# Patient Record
Sex: Female | Born: 1960 | Race: Black or African American | Hispanic: No | Marital: Single | Smoking: Never smoker
Health system: Southern US, Community
[De-identification: ages and names within clinical notes are randomized; demographics above are authoritative.]

## PROBLEM LIST (undated history)

## (undated) DIAGNOSIS — I10 Essential (primary) hypertension: Secondary | ICD-10-CM

## (undated) HISTORY — DX: Essential (primary) hypertension: I10

## (undated) HISTORY — PX: BREAST CYST EXCISION: SHX579

---

## 1998-07-24 ENCOUNTER — Other Ambulatory Visit: Admission: RE | Admit: 1998-07-24 | Discharge: 1998-07-24 | Payer: Self-pay | Admitting: Obstetrics and Gynecology

## 1999-09-08 ENCOUNTER — Other Ambulatory Visit: Admission: RE | Admit: 1999-09-08 | Discharge: 1999-09-08 | Payer: Self-pay | Admitting: Obstetrics and Gynecology

## 1999-09-30 ENCOUNTER — Encounter: Payer: Self-pay | Admitting: Obstetrics and Gynecology

## 1999-09-30 ENCOUNTER — Ambulatory Visit (HOSPITAL_COMMUNITY): Admission: RE | Admit: 1999-09-30 | Discharge: 1999-09-30 | Payer: Self-pay | Admitting: Obstetrics and Gynecology

## 2000-09-12 ENCOUNTER — Other Ambulatory Visit: Admission: RE | Admit: 2000-09-12 | Discharge: 2000-09-12 | Payer: Self-pay | Admitting: Obstetrics and Gynecology

## 2000-10-14 ENCOUNTER — Ambulatory Visit (HOSPITAL_COMMUNITY): Admission: RE | Admit: 2000-10-14 | Discharge: 2000-10-14 | Payer: Self-pay | Admitting: Obstetrics and Gynecology

## 2000-10-14 ENCOUNTER — Encounter: Payer: Self-pay | Admitting: Obstetrics and Gynecology

## 2000-11-04 ENCOUNTER — Emergency Department (HOSPITAL_COMMUNITY): Admission: EM | Admit: 2000-11-04 | Discharge: 2000-11-04 | Payer: Self-pay | Admitting: Emergency Medicine

## 2000-11-04 ENCOUNTER — Encounter: Payer: Self-pay | Admitting: Emergency Medicine

## 2001-09-18 ENCOUNTER — Other Ambulatory Visit: Admission: RE | Admit: 2001-09-18 | Discharge: 2001-09-18 | Payer: Self-pay | Admitting: Obstetrics and Gynecology

## 2002-05-20 ENCOUNTER — Emergency Department (HOSPITAL_COMMUNITY): Admission: EM | Admit: 2002-05-20 | Discharge: 2002-05-20 | Payer: Self-pay | Admitting: Emergency Medicine

## 2005-01-20 ENCOUNTER — Encounter (INDEPENDENT_AMBULATORY_CARE_PROVIDER_SITE_OTHER): Payer: Self-pay | Admitting: *Deleted

## 2005-01-25 ENCOUNTER — Ambulatory Visit: Payer: Self-pay | Admitting: Family Medicine

## 2005-01-27 ENCOUNTER — Ambulatory Visit: Payer: Self-pay | Admitting: Family Medicine

## 2005-02-09 ENCOUNTER — Encounter: Admission: RE | Admit: 2005-02-09 | Discharge: 2005-02-09 | Payer: Self-pay | Admitting: Sports Medicine

## 2005-02-16 ENCOUNTER — Ambulatory Visit: Payer: Self-pay | Admitting: Family Medicine

## 2005-12-27 ENCOUNTER — Ambulatory Visit: Payer: Self-pay | Admitting: Family Medicine

## 2006-01-25 ENCOUNTER — Ambulatory Visit: Payer: Self-pay | Admitting: Family Medicine

## 2006-08-11 ENCOUNTER — Ambulatory Visit: Payer: Self-pay | Admitting: Family Medicine

## 2006-08-23 ENCOUNTER — Ambulatory Visit: Payer: Self-pay | Admitting: Sports Medicine

## 2006-08-25 ENCOUNTER — Encounter: Admission: RE | Admit: 2006-08-25 | Discharge: 2006-08-25 | Payer: Self-pay | Admitting: Sports Medicine

## 2006-12-15 ENCOUNTER — Ambulatory Visit: Payer: Self-pay | Admitting: Family Medicine

## 2007-01-19 DIAGNOSIS — I10 Essential (primary) hypertension: Secondary | ICD-10-CM | POA: Insufficient documentation

## 2007-01-19 DIAGNOSIS — D509 Iron deficiency anemia, unspecified: Secondary | ICD-10-CM

## 2007-01-19 HISTORY — DX: Iron deficiency anemia, unspecified: D50.9

## 2007-01-20 ENCOUNTER — Encounter (INDEPENDENT_AMBULATORY_CARE_PROVIDER_SITE_OTHER): Payer: Self-pay | Admitting: *Deleted

## 2007-06-22 ENCOUNTER — Telehealth: Payer: Self-pay | Admitting: *Deleted

## 2007-07-06 ENCOUNTER — Ambulatory Visit: Payer: Self-pay | Admitting: Family Medicine

## 2007-07-06 ENCOUNTER — Encounter: Payer: Self-pay | Admitting: Family Medicine

## 2007-07-06 DIAGNOSIS — B9689 Other specified bacterial agents as the cause of diseases classified elsewhere: Secondary | ICD-10-CM | POA: Insufficient documentation

## 2007-07-06 DIAGNOSIS — N949 Unspecified condition associated with female genital organs and menstrual cycle: Secondary | ICD-10-CM

## 2007-07-06 DIAGNOSIS — N898 Other specified noninflammatory disorders of vagina: Secondary | ICD-10-CM | POA: Insufficient documentation

## 2007-07-06 DIAGNOSIS — N938 Other specified abnormal uterine and vaginal bleeding: Secondary | ICD-10-CM | POA: Insufficient documentation

## 2007-07-06 DIAGNOSIS — N760 Acute vaginitis: Secondary | ICD-10-CM

## 2007-07-06 LAB — CONVERTED CEMR LAB
CO2: 25 meq/L (ref 19–32)
Calcium: 9.3 mg/dL (ref 8.4–10.5)
Chlamydia, DNA Probe: NEGATIVE
Chloride: 103 meq/L (ref 96–112)
Creatinine, Ser: 0.82 mg/dL (ref 0.40–1.20)
Ferritin: 3 ng/mL — ABNORMAL LOW (ref 10–291)
GC Probe Amp, Genital: NEGATIVE
Glucose, Bld: 84 mg/dL (ref 70–99)
Hemoglobin: 10.2 g/dL
Pap Smear: NORMAL
Potassium: 3.8 meq/L (ref 3.5–5.3)
Sodium: 139 meq/L (ref 135–145)

## 2007-07-18 ENCOUNTER — Encounter: Payer: Self-pay | Admitting: Family Medicine

## 2007-08-28 ENCOUNTER — Encounter: Admission: RE | Admit: 2007-08-28 | Discharge: 2007-08-28 | Payer: Self-pay | Admitting: Sports Medicine

## 2007-09-27 ENCOUNTER — Emergency Department (HOSPITAL_COMMUNITY): Admission: EM | Admit: 2007-09-27 | Discharge: 2007-09-27 | Payer: Self-pay | Admitting: Emergency Medicine

## 2007-11-01 ENCOUNTER — Encounter: Payer: Self-pay | Admitting: Family Medicine

## 2008-08-28 ENCOUNTER — Encounter: Admission: RE | Admit: 2008-08-28 | Discharge: 2008-08-28 | Payer: Self-pay | Admitting: Family Medicine

## 2009-02-03 ENCOUNTER — Encounter: Payer: Self-pay | Admitting: Family Medicine

## 2009-02-03 ENCOUNTER — Ambulatory Visit: Payer: Self-pay | Admitting: Family Medicine

## 2009-02-03 DIAGNOSIS — N920 Excessive and frequent menstruation with regular cycle: Secondary | ICD-10-CM | POA: Insufficient documentation

## 2009-02-03 HISTORY — DX: Excessive and frequent menstruation with regular cycle: N92.0

## 2009-02-03 LAB — CONVERTED CEMR LAB

## 2009-02-07 ENCOUNTER — Encounter: Payer: Self-pay | Admitting: Family Medicine

## 2009-02-07 ENCOUNTER — Ambulatory Visit: Payer: Self-pay | Admitting: Family Medicine

## 2009-02-07 LAB — CONVERTED CEMR LAB
CO2: 22 meq/L (ref 19–32)
Creatinine, Ser: 0.85 mg/dL (ref 0.40–1.20)
LDL Cholesterol: 110 mg/dL — ABNORMAL HIGH (ref 0–99)
Sodium: 137 meq/L (ref 135–145)
Total CHOL/HDL Ratio: 2.3

## 2009-09-01 ENCOUNTER — Encounter: Admission: RE | Admit: 2009-09-01 | Discharge: 2009-09-01 | Payer: Self-pay | Admitting: Family Medicine

## 2009-09-01 ENCOUNTER — Ambulatory Visit: Payer: Self-pay | Admitting: Family Medicine

## 2009-09-01 DIAGNOSIS — M79609 Pain in unspecified limb: Secondary | ICD-10-CM | POA: Insufficient documentation

## 2009-09-22 ENCOUNTER — Encounter: Admission: RE | Admit: 2009-09-22 | Discharge: 2009-09-22 | Payer: Self-pay | Admitting: Family Medicine

## 2010-09-23 ENCOUNTER — Encounter: Admission: RE | Admit: 2010-09-23 | Discharge: 2010-09-23 | Payer: Self-pay

## 2011-09-10 ENCOUNTER — Ambulatory Visit (INDEPENDENT_AMBULATORY_CARE_PROVIDER_SITE_OTHER): Payer: BC Managed Care – PPO | Admitting: Family Medicine

## 2011-09-10 ENCOUNTER — Telehealth: Payer: Self-pay | Admitting: Family Medicine

## 2011-09-10 ENCOUNTER — Encounter: Payer: Self-pay | Admitting: Family Medicine

## 2011-09-10 VITALS — BP 172/110 | HR 69 | Temp 98.4°F | Ht 68.0 in | Wt 145.0 lb

## 2011-09-10 DIAGNOSIS — I10 Essential (primary) hypertension: Secondary | ICD-10-CM

## 2011-09-10 DIAGNOSIS — N898 Other specified noninflammatory disorders of vagina: Secondary | ICD-10-CM

## 2011-09-10 DIAGNOSIS — N760 Acute vaginitis: Secondary | ICD-10-CM

## 2011-09-10 LAB — CBC
HCT: 42.1 % (ref 36.0–46.0)
MCH: 31.7 pg (ref 26.0–34.0)
MCV: 95.9 fL (ref 78.0–100.0)
RBC: 4.39 MIL/uL (ref 3.87–5.11)
WBC: 3.2 10*3/uL — ABNORMAL LOW (ref 4.0–10.5)

## 2011-09-10 LAB — BASIC METABOLIC PANEL
BUN: 12 mg/dL (ref 6–23)
CO2: 25 mEq/L (ref 19–32)
Calcium: 10 mg/dL (ref 8.4–10.5)
Chloride: 105 mEq/L (ref 96–112)
Sodium: 142 mEq/L (ref 135–145)

## 2011-09-10 LAB — POCT WET PREP (WET MOUNT)

## 2011-09-10 MED ORDER — LISINOPRIL-HYDROCHLOROTHIAZIDE 20-25 MG PO TABS: 1.0000 | ORAL_TABLET | Freq: Every day | ORAL | Status: DC

## 2011-09-10 MED ORDER — METRONIDAZOLE 500 MG PO TABS: 500.0000 mg | ORAL_TABLET | Freq: Two times a day (BID) | ORAL | Status: AC

## 2011-09-10 NOTE — Progress Notes (Signed)
Subjective: The patient presents today for an annual exam. She reports that she is out of her blood pressure medicine and has been for some time. She also reports that she has been having vaginal discharge on and off for several months. She has had this previously was diagnosed with bacterial vaginosis. She has one partner, and is in a monogamous relationship. She reports no pain or itching. He does not report any blood. Her discharge is whitish in color ration.  The patient denies any headaches, fevers, chills, visual changes, chest pain, shortness of breath, or lower extremity edema. 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  Patient's past medical history, social history, medications, and allergies were reviewed.  Objective:  Filed Vitals:   09/10/11 0833  BP: 172/110  Pulse: 69  Temp: 98.4 F (36.9 C)   Gen: No acute distress CV: Regular rate and rhythm, no murmurs appreciated Resp: Clear to auscultation bilaterally Abd: Soft, nontender nondistended Pelvic: Normal external female genitalia. No significant discharge. Vaginal mucosa is normal. Cervix is normal in appearance. There is no friability. There is no cervical motion tenderness. There is no adnexal tenderness. Uterus is small and firm without obvious masses.  Assessment/Plan: As the patient's blood pressure is significantly elevated we will restart her antihypertensives. We will also obtain some basic laboratory work at this time. The patient will be instructed to return in 2 weeks for a recheck of her blood pressure.  I am somewhat underwhelmed by the patient's vaginal discharge. Wet prep and GC chlamydia were obtained today. We'll treat if the patient has evidence of bacterial vaginosis or other infection.  Please also see individual problems in problem list for problem-specific plans.

## 2011-09-10 NOTE — Telephone Encounter (Signed)
Called to inform of BV.  Pt answered and expressed understanding.

## 2011-09-10 NOTE — Patient Instructions (Signed)
It was grapeseed today! We will obtain some basic lab work today, as we have not had this in some time. I have sent in a refill on your blood pressure medication. I want to come back in 2 weeks for [place] to check your blood pressure and see if we can make any changes. I will call you if you need any treatment for your vaginal discharge.

## 2011-09-11 ENCOUNTER — Encounter: Payer: Self-pay | Admitting: Family Medicine

## 2011-09-11 LAB — GC/CHLAMYDIA PROBE AMP, GENITAL
Chlamydia, DNA Probe: NEGATIVE
GC Probe Amp, Genital: NEGATIVE

## 2011-09-20 ENCOUNTER — Ambulatory Visit (INDEPENDENT_AMBULATORY_CARE_PROVIDER_SITE_OTHER): Payer: BC Managed Care – PPO | Admitting: Family Medicine

## 2011-09-20 ENCOUNTER — Encounter: Payer: Self-pay | Admitting: Family Medicine

## 2011-09-20 VITALS — BP 127/85 | HR 77 | Temp 98.2°F | Ht 68.0 in | Wt 145.3 lb

## 2011-09-20 DIAGNOSIS — B9689 Other specified bacterial agents as the cause of diseases classified elsewhere: Secondary | ICD-10-CM

## 2011-09-20 DIAGNOSIS — N760 Acute vaginitis: Secondary | ICD-10-CM

## 2011-09-20 DIAGNOSIS — A499 Bacterial infection, unspecified: Secondary | ICD-10-CM

## 2011-09-20 MED ORDER — METRONIDAZOLE 500 MG PO TABS: ORAL_TABLET | ORAL | Status: AC

## 2011-09-20 NOTE — Progress Notes (Signed)
Subjective: The patient presents today for followup on her blood pressure and bacterial vaginosis. Her blood pressure is much improved today. She's not having any problems with chest pain, visual changes, headaches, or shortness of breath. Her bacterial vaginosis is somewhat improved, although she does continue to have discharge and she has to wear a pad. The options for treatment were discussed in detail with her. Will be her preference to stay with oral therapy at this point in time.  Objective:  Filed Vitals:   09/20/11 1045  BP: 127/85  Pulse: 77  Temp: 98.2 F (36.8 C)   Gen: No acute distress, appropriate weight, appropriate throughout interaction  Assessment/Plan: After significant discussion with the patient, we have decided to proceed with a two-week course of metronidazole followed by twice weekly dosing. The patient would much prefer an oral regimen. I discussed with her the options of clindamycin gel, metronidazole gel, and other antibiotics. As she has had some success with metronidazole for, she would refer to stay with that for now. We will plan to have her followup in 3-4 weeks if she is not improving, but will otherwise followup on an as-needed basis.  Please also see individual problems in problem list for problem-specific plans.

## 2011-09-20 NOTE — Patient Instructions (Signed)
Take the flagyl pills, one twice per day for the next two weeks.  Then go to one pill one time daily on two days out of the week for the next 6 months.

## 2011-09-20 NOTE — Assessment & Plan Note (Signed)
After significant discussion with the patient, we have decided to proceed with a two-week course of metronidazole followed by twice weekly dosing. The patient would much prefer an oral regimen. I discussed with her the options of clindamycin gel, metronidazole gel, and other antibiotics. As she has had some success with metronidazole for, she would refer to stay with that for now. We will plan to have her followup in 3-4 weeks if she is not improving, but will otherwise followup on an as-needed basis.

## 2011-10-07 ENCOUNTER — Other Ambulatory Visit: Payer: Self-pay | Admitting: Family Medicine

## 2011-10-07 DIAGNOSIS — Z1231 Encounter for screening mammogram for malignant neoplasm of breast: Secondary | ICD-10-CM

## 2011-10-15 ENCOUNTER — Ambulatory Visit
Admission: RE | Admit: 2011-10-15 | Discharge: 2011-10-15 | Disposition: A | Discharge status: Home / Self Care | Payer: BC Managed Care – PPO | Source: Ambulatory Visit | Attending: Internal Medicine | Admitting: Internal Medicine

## 2011-10-15 DIAGNOSIS — Z1231 Encounter for screening mammogram for malignant neoplasm of breast: Secondary | ICD-10-CM

## 2011-12-07 ENCOUNTER — Other Ambulatory Visit: Payer: Self-pay | Admitting: Family Medicine

## 2011-12-07 NOTE — Telephone Encounter (Signed)
Refill request

## 2011-12-20 ENCOUNTER — Ambulatory Visit (INDEPENDENT_AMBULATORY_CARE_PROVIDER_SITE_OTHER): Payer: BC Managed Care – PPO

## 2011-12-20 DIAGNOSIS — J301 Allergic rhinitis due to pollen: Secondary | ICD-10-CM

## 2011-12-20 DIAGNOSIS — J019 Acute sinusitis, unspecified: Secondary | ICD-10-CM

## 2012-01-31 ENCOUNTER — Other Ambulatory Visit: Payer: Self-pay | Admitting: Family Medicine

## 2012-01-31 NOTE — Telephone Encounter (Signed)
Refill request

## 2012-02-01 ENCOUNTER — Other Ambulatory Visit: Payer: Self-pay | Admitting: Family Medicine

## 2012-02-01 NOTE — Telephone Encounter (Signed)
Refill request

## 2012-03-31 ENCOUNTER — Ambulatory Visit (INDEPENDENT_AMBULATORY_CARE_PROVIDER_SITE_OTHER): Payer: BC Managed Care – PPO | Admitting: Family Medicine

## 2012-03-31 ENCOUNTER — Ambulatory Visit: Payer: BC Managed Care – PPO

## 2012-03-31 VITALS — BP 128/79 | HR 85 | Temp 98.8°F | Resp 18 | Ht 67.5 in | Wt 145.0 lb

## 2012-03-31 DIAGNOSIS — M25569 Pain in unspecified knee: Secondary | ICD-10-CM

## 2012-03-31 DIAGNOSIS — M25469 Effusion, unspecified knee: Secondary | ICD-10-CM

## 2012-03-31 MED ORDER — MELOXICAM 7.5 MG PO TABS: 7.5000 mg | ORAL_TABLET | Freq: Every day | ORAL | Status: DC

## 2012-03-31 NOTE — Progress Notes (Signed)
[facility]:  Office Visit  Chief Complaint:  Chief Complaint  Patient presents with  . Leg Swelling    right knee x 2 weeks    HPI: Lynn Conley is a 51 y.o. female who complains of  Right knee pain with mild swelling x 2 weeks. No trauma.No prior knee surgeries. Denies clicking, popping, instability. She noticed pain and swelling after being on feet all day one day and might have iverused it. Worse with going up and down stairs. Primarily right knee. Has not tried anything. Denies redness, warmth, recent illness.   Past Medical History  Diagnosis Date  . Hypertension    History reviewed. No pertinent past surgical history. History   Social History  . Marital Status: Single    Spouse Name: N/A    Number of Children: N/A  . Years of Education: N/A   Social History Main Topics  . Smoking status: Never Smoker   . Smokeless tobacco: None  . Alcohol Use: No  . Drug Use: No  . Sexually Active: Yes -- Female partner(s)    Birth Control/ Protection: None   Other Topics Concern  . None   Social History Narrative  . None   No family history on file. No Known Allergies Prior to Admission medications   Medication Sig Start Date End Date Taking? Authorizing Provider  lisinopril-hydrochlorothiazide (PRINZIDE,ZESTORETIC) 20-25 MG per tablet TAKE 1 TABLET BY MOUTH EVERY DAY 01/31/12  Yes Brent Bulla, MD  lisinopril-hydrochlorothiazide (PRINZIDE,ZESTORETIC) 20-25 MG per tablet TAKE 1 TABLET BY MOUTH EVERY DAY 02/01/12   Brent Bulla, MD  lisinopril-hydrochlorothiazide (PRINZIDE,ZESTORETIC) 20-25 MG per tablet TAKE 1 TABLET BY MOUTH EVERY DAY 02/01/12   Brent Bulla, MD     ROS: The patient denies fevers, chills, night sweats, unintentional weight loss, chest pain, palpitations, wheezing, dyspnea on exertion, nausea, vomiting, abdominal pain, dysuria, hematuria, melena, numbness, weakness, or tingling. + knee pain  All other systems have been reviewed and were  otherwise negative with the exception of those mentioned in the HPI and as above.    PHYSICAL EXAM: Filed Vitals:   03/31/12 1743  BP: 128/79  Pulse: 85  Temp: 98.8 F (37.1 C)  Resp: 18   Filed Vitals:   03/31/12 1743  Height: 5' 7.5" (1.715 m)  Weight: 145 lb (65.772 kg)   Body mass index is 22.38 kg/(m^2).  General: Alert, no acute distress HEENT:  Normocephalic, atraumatic, oropharynx patent.  Cardiovascular:  Regular rate and rhythm, no rubs murmurs or gallops.  No Carotid bruits, radial pulse intact. No pedal edema.  Respiratory: Clear to auscultation bilaterally.  No wheezes, rales, or rhonchi.  No cyanosis, no use of accessory musculature GI: No organomegaly, abdomen is soft and non-tender, positive bowel sounds.  No masses. Skin: No rashes. Neurologic: Facial musculature symmetric. Psychiatric: Patient is appropriate throughout our interaction. Lymphatic: No cervical lymphadenopathy Musculoskeletal: Gait intact. Right knee: + effusion, no crepitus, no warmth, full AROM/PROM, intact sensation, 5/5 strength, neg McMurray, neg jt line tenderness, neg Lachman, + DP  LABS:   EKG/XRAY:   Primary read interpreted by Dr. Conley Rolls at [facility]. Knee xray: no dislocation/fx. Min effusion   ASSESSMENT/PLAN: Encounter Diagnoses  Name Primary?  . Knee pain, acute Yes  . Knee swelling    Reactive arthritis vs OA  with effusion.  1. RX RICE, Mobic and ROM 2. Defer injection until no improvement  F/u prn if wants injection in 1 month   Avory Mimbs  PHUONG, DO 04/02/2012 6:54 AM

## 2012-04-02 ENCOUNTER — Encounter: Payer: Self-pay | Admitting: Family Medicine

## 2012-05-03 ENCOUNTER — Other Ambulatory Visit: Payer: Self-pay | Admitting: Family Medicine

## 2012-07-13 ENCOUNTER — Other Ambulatory Visit: Payer: Self-pay | Admitting: Family Medicine

## 2012-08-15 ENCOUNTER — Other Ambulatory Visit: Payer: Self-pay | Admitting: Family Medicine

## 2012-09-15 ENCOUNTER — Other Ambulatory Visit: Payer: Self-pay | Admitting: Family Medicine

## 2012-09-15 ENCOUNTER — Other Ambulatory Visit: Payer: Self-pay | Admitting: Internal Medicine

## 2012-09-15 DIAGNOSIS — Z1231 Encounter for screening mammogram for malignant neoplasm of breast: Secondary | ICD-10-CM

## 2012-10-27 ENCOUNTER — Ambulatory Visit
Admission: RE | Admit: 2012-10-27 | Discharge: 2012-10-27 | Disposition: A | Discharge status: Home / Self Care | Payer: BC Managed Care – PPO | Source: Ambulatory Visit | Attending: Family Medicine | Admitting: Family Medicine

## 2012-10-27 DIAGNOSIS — Z1231 Encounter for screening mammogram for malignant neoplasm of breast: Secondary | ICD-10-CM

## 2013-02-21 ENCOUNTER — Other Ambulatory Visit: Payer: Self-pay | Admitting: *Deleted

## 2013-02-21 MED ORDER — LISINOPRIL-HYDROCHLOROTHIAZIDE 20-25 MG PO TABS: ORAL_TABLET | ORAL | Status: DC

## 2013-02-26 ENCOUNTER — Other Ambulatory Visit (HOSPITAL_COMMUNITY)
Admission: RE | Admit: 2013-02-26 | Discharge: 2013-02-26 | Disposition: A | Discharge status: Home / Self Care | Payer: BC Managed Care – PPO | Source: Ambulatory Visit | Attending: Family Medicine | Admitting: Family Medicine

## 2013-02-26 ENCOUNTER — Ambulatory Visit (INDEPENDENT_AMBULATORY_CARE_PROVIDER_SITE_OTHER): Payer: BC Managed Care – PPO | Admitting: Family Medicine

## 2013-02-26 ENCOUNTER — Encounter: Payer: Self-pay | Admitting: Family Medicine

## 2013-02-26 VITALS — BP 154/85 | HR 78 | Temp 98.3°F | Ht 68.0 in | Wt 156.8 lb

## 2013-02-26 DIAGNOSIS — B9689 Other specified bacterial agents as the cause of diseases classified elsewhere: Secondary | ICD-10-CM

## 2013-02-26 DIAGNOSIS — N760 Acute vaginitis: Secondary | ICD-10-CM

## 2013-02-26 DIAGNOSIS — Z124 Encounter for screening for malignant neoplasm of cervix: Secondary | ICD-10-CM

## 2013-02-26 DIAGNOSIS — I10 Essential (primary) hypertension: Secondary | ICD-10-CM

## 2013-02-26 DIAGNOSIS — Z01419 Encounter for gynecological examination (general) (routine) without abnormal findings: Secondary | ICD-10-CM | POA: Insufficient documentation

## 2013-02-26 DIAGNOSIS — A499 Bacterial infection, unspecified: Secondary | ICD-10-CM

## 2013-02-26 DIAGNOSIS — Z113 Encounter for screening for infections with a predominantly sexual mode of transmission: Secondary | ICD-10-CM | POA: Insufficient documentation

## 2013-02-26 LAB — CBC WITH DIFFERENTIAL/PLATELET
Basophils Absolute: 0 10*3/uL (ref 0.0–0.1)
Basophils Relative: 0 % (ref 0–1)
Eosinophils Absolute: 0 10*3/uL (ref 0.0–0.7)
Eosinophils Relative: 1 % (ref 0–5)
HCT: 39 % (ref 36.0–46.0)
Hemoglobin: 13.1 g/dL (ref 12.0–15.0)
MCH: 30.7 pg (ref 26.0–34.0)
MCHC: 33.6 g/dL (ref 30.0–36.0)
MCV: 91.3 fL (ref 78.0–100.0)
Monocytes Absolute: 0.3 10*3/uL (ref 0.1–1.0)
Monocytes Relative: 10 % (ref 3–12)
Neutro Abs: 1.2 10*3/uL — ABNORMAL LOW (ref 1.7–7.7)
RDW: 13.6 % (ref 11.5–15.5)

## 2013-02-26 LAB — POCT WET PREP (WET MOUNT)

## 2013-02-26 LAB — BASIC METABOLIC PANEL
BUN: 17 mg/dL (ref 6–23)
Calcium: 9.7 mg/dL (ref 8.4–10.5)
Creat: 0.79 mg/dL (ref 0.50–1.10)
Glucose, Bld: 87 mg/dL (ref 70–99)

## 2013-02-26 MED ORDER — METRONIDAZOLE 500 MG PO TABS: ORAL_TABLET | ORAL | Status: DC

## 2013-02-26 NOTE — Patient Instructions (Addendum)
I am giving you an antibiotic to take for your bacterial vaginosis.  Take one pill twice per day for a week then one pill two days per week for the next 3 months. I am getting basic blood work on you today. Please come back for a blood pressure check with one of our nurses sometime next week.

## 2013-03-01 ENCOUNTER — Encounter: Payer: Self-pay | Admitting: Family Medicine

## 2013-03-04 NOTE — Assessment & Plan Note (Signed)
Wet prep does not show today but patient reports that she is still having problems with discharge like when she has had BV in the past.  Will resume suppressive flagyl treatment per PI.

## 2013-03-04 NOTE — Progress Notes (Signed)
Patient ID: Lynn Conley, female   DOB: 12/20/1960, 52 y.o.   MRN: [id number] SUBJECTIVE:  52 y.o. female for annual routine Pap and checkup. Current Outpatient Prescriptions  Medication Sig Dispense Refill  . lisinopril-hydrochlorothiazide (PRINZIDE,ZESTORETIC) 20-25 MG per tablet TAKE 1 TABLET BY MOUTH EVERY DAY  30 tablet  9  . meloxicam (MOBIC) 7.5 MG tablet TAKE 1 TABLET BY MOUTH EVERY DAY  60 tablet  0  . metroNIDAZOLE (FLAGYL) 500 MG tablet Take 500mg  by mouth two times per day for a week then take 500mg  by mouth 2 days per week for the next 3 months.  36 tablet  0   No current facility-administered medications for this visit.   Allergies: Review of patient's allergies indicates no known allergies.  Patient's last menstrual period was 08/23/2011.  ROS:  Feeling well. No dyspnea or chest pain on exertion.  No abdominal pain, change in bowel habits, black or bloody stools.  No urinary tract symptoms. GYN ROS: no breast pain or new or enlarging lumps on self exam, no vaginal bleeding, no pelvic pain. No neurological complaints. Does continue to have problems with BV.  Has not been taking Flagyl 2x month as recommended last year.  OBJECTIVE:  The patient appears well, alert, oriented x 3, in no distress. BP 154/85  Pulse 78  Temp(Src) 98.3 F (36.8 C) (Oral)  Ht 5\' 8"  (1.727 m)  Wt 156 lb 12.8 oz (71.124 kg)  BMI 23.85 kg/m2  LMP 08/23/2011 ENT normal.  Neck supple. No adenopathy or thyromegaly. PERLA. Lungs are clear, good air entry, no wheezes, rhonchi or rales. S1 and S2 normal, no murmurs, regular rate and rhythm. Abdomen soft without tenderness, guarding, mass or organomegaly. Extremities show no edema, normal peripheral pulses. Neurological is normal, no focal findings.  BREAST EXAM: not examined  PELVIC EXAM: normal external genitalia, vulva, vagina, cervix, uterus and adnexa  ASSESSMENT:  well woman Mildly elevated blood pressure, on bp meds Chronic relapsing  BV   PLAN:  mammogram pap smear return annually or prn

## 2013-03-04 NOTE — Assessment & Plan Note (Signed)
Patient reports has not taken in several days.  Refill ordered.  RTC 1 week for bp check.  May need to increase bp meds if not controlled.

## 2013-04-30 ENCOUNTER — Ambulatory Visit (INDEPENDENT_AMBULATORY_CARE_PROVIDER_SITE_OTHER): Payer: BC Managed Care – PPO | Admitting: Physician Assistant

## 2013-04-30 VITALS — BP 158/102 | HR 89 | Temp 98.2°F | Resp 16 | Ht 69.0 in | Wt 154.5 lb

## 2013-04-30 DIAGNOSIS — R05 Cough: Secondary | ICD-10-CM

## 2013-04-30 DIAGNOSIS — R0989 Other specified symptoms and signs involving the circulatory and respiratory systems: Secondary | ICD-10-CM

## 2013-04-30 DIAGNOSIS — R059 Cough, unspecified: Secondary | ICD-10-CM

## 2013-04-30 DIAGNOSIS — J309 Allergic rhinitis, unspecified: Secondary | ICD-10-CM

## 2013-04-30 DIAGNOSIS — I10 Essential (primary) hypertension: Secondary | ICD-10-CM

## 2013-04-30 MED ORDER — CLONIDINE HCL 0.1 MG PO TABS
0.1000 mg | ORAL_TABLET | Freq: Two times a day (BID) | ORAL | Status: DC
Start: 2013-04-30 — End: 2013-06-01

## 2013-04-30 MED ORDER — LISINOPRIL 20 MG PO TABS: 20.0000 mg | ORAL_TABLET | Freq: Every day | ORAL | Status: DC

## 2013-04-30 MED ORDER — IPRATROPIUM BROMIDE 0.06 % NA SOLN: 2.0000 | Freq: Three times a day (TID) | NASAL | Status: DC

## 2013-04-30 MED ORDER — HYDROCODONE-HOMATROPINE 5-1.5 MG/5ML PO SYRP: ORAL_SOLUTION | ORAL | Status: DC

## 2013-04-30 NOTE — Progress Notes (Signed)
Patient ID: Lynn Conley MRN: [id number], DOB: Apr 07, 1961, 52 y.o. Date of Encounter: 04/30/2013, 10:32 AM  Primary Physician: Majel Homer, MD  Chief Complaint: Allergies  HPI: 52 y.o. female with history below presents with 3 day history of nasal congestion, rhinorrhea, post nasal drip, and cough. Afebrile. No chills. Cough is mildly productive of clear sputum. Cough not associated with time of day. No SOB, wheezing, chest pain, or chest tightness. Ears feel full. Has tried Mucinex without much success. Decreased appetite. One sick contact at work the previous week.   Has not yet taken her blood pressure medication today. On lisinopril/HCTZ 20/25 mg daily. Takes daily. Does not routinely check at home. Had CPE 2 months ago. No chest pain, chest tightness, HA, vision changes, or focal deficits. Not due to see PCP for 10 months unless she needs anything.     Past Medical History  Diagnosis Date  . Hypertension      Home Meds: Prior to Admission medications   Medication Sig Start Date End Date Taking? Authorizing Provider  lisinopril-hydrochlorothiazide (PRINZIDE,ZESTORETIC) 20-25 MG per tablet TAKE 1 TABLET BY MOUTH EVERY DAY 02/21/13  Yes Brent Bulla, MD  meloxicam (MOBIC) 7.5 MG tablet TAKE 1 TABLET BY MOUTH EVERY DAY 07/13/12  Yes Nelva Nay, PA-C                                Allergies: No Known Allergies  History   Social History  . Marital Status: Single    Spouse Name: N/A    Number of Children: N/A  . Years of Education: N/A   Occupational History  . Not on file.   Social History Main Topics  . Smoking status: Never Smoker   . Smokeless tobacco: Not on file  . Alcohol Use: No  . Drug Use: No  . Sexually Active: Yes -- Female partner(s)    Birth Control/ Protection: None   Other Topics Concern  . Not on file   Social History Narrative  . No narrative on file     Review of Systems: Constitutional: positive for fatigue. negative for chills or fever    HEENT: see above Cardiovascular: negative for chest pain or palpitations Respiratory: positive for cough. negative for hemoptysis, wheezing, or shortness of breath Abdominal: negative for abdominal pain, nausea, vomiting, or diarrhea Dermatological: negative for rash Neurologic: negative for headache, dizziness, or syncope   Physical Exam: Blood pressure 158/102, pulse 89, temperature 98.2 F (36.8 C), temperature source Oral, resp. rate 16, height 5\' 9"  (1.753 m), weight 154 lb 8 oz (70.081 kg), last menstrual period 08/23/2011, SpO2 98.00%., Body mass index is 22.81 kg/(m^2). Triage BP 160/110.  General: Well developed, well nourished, in no acute distress. Head: Normocephalic, atraumatic, eyes without discharge, sclera non-icteric, nares are congested and boggy. Bilateral auditory canals clear, TM's are without perforation, pearly grey and translucent with reflective cone of light bilaterally. Oral cavity moist, posterior pharynx without exudate, erythema, peritonsillar abscess, or post nasal drip. Uvula midline.   Neck: Supple. No thyromegaly. Full ROM. No lymphadenopathy. Lungs: Clear bilaterally to auscultation without wheezes, rales, or rhonchi. Breathing is unlabored. Heart: RRR with S1 S2. No murmurs, rubs, or gallops appreciated. Msk:  Strength and tone normal for age. Extremities/Skin: Warm and dry. No clubbing or cyanosis. No edema. No rashes or suspicious lesions. Neuro: Alert and oriented X 3. Moves all extremities spontaneously. Gait is normal. CNII-XII grossly in tact.  Psych:  Responds to questions appropriately with a normal affect.     ASSESSMENT AND PLAN:  51 y.o. female with allergic rhinitis and hypertension.   1) Allergic rhinitis  -Atrovent NS 0.06% 2 sprays each nare bid prn #1 no RF -Hycodan #4oz 1 tsp po q 4-6 hours prn cough no RF SED -Out of work today  2) Hypertension -Not controlled -Continue current medication -Add Clonidine 0.1 mg 1 po bid #4 no  RF -Add lisinopril 20 mg 1 po daily #30 no RF -Follow up with PCP before run out of medication  -RTC/ER precautions discussed in detail    Signed, Eula Listen, PA-C 04/30/2013 10:32 AM

## 2013-05-03 ENCOUNTER — Telehealth: Payer: Self-pay

## 2013-05-03 MED ORDER — FLUTICASONE PROPIONATE 50 MCG/ACT NA SUSP: 2.0000 | Freq: Every day | NASAL | Status: DC

## 2013-05-03 NOTE — Telephone Encounter (Signed)
done

## 2013-05-03 NOTE — Telephone Encounter (Signed)
Pt needs a different nasal spray called in. Current one dries out her throat.  [phone].

## 2013-05-04 NOTE — Telephone Encounter (Signed)
Notified pt. 

## 2013-05-08 ENCOUNTER — Ambulatory Visit (INDEPENDENT_AMBULATORY_CARE_PROVIDER_SITE_OTHER): Payer: BC Managed Care – PPO | Admitting: Physician Assistant

## 2013-05-08 ENCOUNTER — Ambulatory Visit: Payer: BC Managed Care – PPO

## 2013-05-08 VITALS — BP 152/92 | HR 98 | Temp 98.3°F | Resp 18 | Ht 68.5 in | Wt 151.0 lb

## 2013-05-08 DIAGNOSIS — J029 Acute pharyngitis, unspecified: Secondary | ICD-10-CM

## 2013-05-08 DIAGNOSIS — R05 Cough: Secondary | ICD-10-CM

## 2013-05-08 DIAGNOSIS — I10 Essential (primary) hypertension: Secondary | ICD-10-CM

## 2013-05-08 DIAGNOSIS — J329 Chronic sinusitis, unspecified: Secondary | ICD-10-CM

## 2013-05-08 DIAGNOSIS — R059 Cough, unspecified: Secondary | ICD-10-CM

## 2013-05-08 LAB — POCT CBC
HCT, POC: 40.7 % (ref 37.7–47.9)
Hemoglobin: 12.8 g/dL (ref 12.2–16.2)
Lymph, poc: 1.4 (ref 0.6–3.4)
MCH, POC: 31 pg (ref 27–31.2)
MCHC: 31.4 g/dL — AB (ref 31.8–35.4)
MPV: 9.1 fL (ref 0–99.8)
POC MID %: 6.2 %M (ref 0–12)
RBC: 4.13 M/uL (ref 4.04–5.48)
WBC: 10.2 10*3/uL (ref 4.6–10.2)

## 2013-05-08 MED ORDER — LEVOFLOXACIN 500 MG PO TABS: 500.0000 mg | ORAL_TABLET | Freq: Every day | ORAL | Status: DC

## 2013-05-08 MED ORDER — PREDNISONE 20 MG PO TABS: ORAL_TABLET | ORAL | Status: DC

## 2013-05-08 MED ORDER — BENZONATATE 100 MG PO CAPS: 200.0000 mg | ORAL_CAPSULE | Freq: Three times a day (TID) | ORAL | Status: DC | PRN

## 2013-05-08 NOTE — Progress Notes (Signed)
Patient ID: Lynn Conley MRN: [id number], DOB: 1960-12-02, 51 y.o. Date of Encounter: 05/08/2013, 9:33 AM  Primary Physician: Majel Homer, MD  Chief Complaint:  Chief Complaint  Patient presents with  . Cough    was seen in office last week, she is not getting better  . Sore Throat    HPI: 52 y.o. female presents with a 11 day history of continued nasal congestion, post nasal drip, sore throat, and cough. Patient initially seen on 04/30/13 and treated with Atrovent nasal spray and Hycodan cough syrup. She was then switched to Flonase secondary to irritation from the Atrovent nasal. Sinus pressure. Subjective fever and chills. Nasal congestion thick and green/yellow. Cough is sometimes productive of green/yellow sputum and not associated with time of day. She states it sounds productive. No SOB or wheezing. Ears feel full, leading to sensation of muffled hearing. Right more so than left. No GI complaints. Appetite decreased.   It is unclear if she has picked up the lisinopril that was added to her antihypertensive therapy. She did pick up the Clonidine and states that is all that was prescribed to her. Upon stating that I added an extra lisinopril to her regimen to take several times she then states "oh yeah, I did get that." No chest pain, headache, vision changes, or focal deficits.   Past Medical History  Diagnosis Date  . Hypertension      Home Meds: Prior to Admission medications   Medication Sig Start Date End Date Taking? Authorizing Provider  cloNIDine (CATAPRES) 0.1 MG tablet Take 1 tablet (0.1 mg total) by mouth 2 (two) times daily. 04/30/13  Yes Hasani Diemer M Sonnet Rizor, PA-C  fluticasone (FLONASE) 50 MCG/ACT nasal spray Place 2 sprays into the nose daily. 05/03/13  Yes Joanmarie Tsang M Farin Buhman, PA-C  HYDROcodone-homatropine [facility]) 5-1.5 MG/5ML syrup 1 TSP PO Q 4-6 HOURS PRN COUGH 04/30/13  Yes Jahden Schara M Nykolas Bacallao, PA-C  lisinopril (PRINIVIL,ZESTRIL) 20 MG tablet Take 1 tablet (20 mg total) by mouth daily.  04/30/13  Yes Waylan Busta M Lyvia Mondesir, PA-C  meloxicam (MOBIC) 7.5 MG tablet TAKE 1 TABLET BY MOUTH EVERY DAY 07/13/12  Yes Nelva Nay, PA-C    Allergies: No Known Allergies  History   Social History  . Marital Status: Single    Spouse Name: N/A    Number of Children: N/A  . Years of Education: N/A   Occupational History  . Not on file.   Social History Main Topics  . Smoking status: Never Smoker   . Smokeless tobacco: Not on file  . Alcohol Use: No  . Drug Use: No  . Sexually Active: Yes -- Female partner(s)    Birth Control/ Protection: None   Other Topics Concern  . Not on file   Social History Narrative  . No narrative on file     Review of Systems: Constitutional: positive for fever, chills, and fatigue HEENT: see above Cardiovascular: negative for chest pain or palpitations Respiratory: positive for cough. negative for hemoptysis, wheezing, or shortness of breath Abdominal: negative for abdominal pain, nausea, vomiting or diarrhea Dermatological: negative for rash Neurologic: negative for headache   Physical Exam: Blood pressure 152/92, pulse 98, temperature 98.3 F (36.8 C), temperature source Oral, resp. rate 18, height 5' 8.5" (1.74 m), weight 151 lb (68.493 kg), last menstrual period 08/23/2011, SpO2 97.00%., Body mass index is 22.62 kg/(m^2). General: Well developed, well nourished, in no acute distress. Head: Normocephalic, atraumatic, eyes without discharge, sclera non-icteric, nares are congested. Bilateral auditory  canals clear, TM's are without perforation, pearly grey with reflective cone of light bilaterally. Maxillary and frontal sinus TTP. Oral cavity moist, dentition normal. Posterior pharynx with post nasal drip and mild erythema. No peritonsillar abscess or tonsillar exudate. Uvula midline.  Neck: Supple. No thyromegaly. Full ROM. No lymphadenopathy. Lungs: Coarse breath sounds bilaterally without wheezes, rales, or rhonchi. Breathing is unlabored.  Heart:  RRR with S1 S2. No murmurs, rubs, or gallops appreciated. Msk:  Strength and tone normal for age. Extremities: No clubbing or cyanosis. No edema. Neuro: Alert and oriented X 3. Moves all extremities spontaneously. CNII-XII grossly in tact. Psych:  Responds to questions appropriately with a normal affect.   Labs: Results for orders placed in visit on 05/08/13  POCT CBC      Result Value Range   WBC 10.2  4.6 - 10.2 K/uL   Lymph, poc 1.4  0.6 - 3.4   POC LYMPH PERCENT 14.1  10 - 50 %L   MID (cbc) 0.6  0 - 0.9   POC MID % 6.2  0 - 12 %M   POC Granulocyte 8.1 (*) 2 - 6.9   Granulocyte percent 79.7  37 - 80 %G   RBC 4.13  4.04 - 5.48 M/uL   Hemoglobin 12.8  12.2 - 16.2 g/dL   HCT, POC 16.1  09.6 - 47.9 %   MCV 98.6 (*) 80 - 97 fL   MCH, POC 31.0  27 - 31.2 pg   MCHC 31.4 (*) 31.8 - 35.4 g/dL   RDW, POC 04.5     Platelet Count, POC 196  142 - 424 K/uL   MPV 9.1  0 - 99.8 fL   CXR: [facility] reading (PRIMARY) by  Dr. Perrin Maltese. Increased perihilar markings, otherwise negative.    ASSESSMENT AND PLAN:  52 y.o. female with sinobronchitis, cough, and hypertension  1) Sinobronchitis/cough -Levaquin 500 mg 1 po daily #10 no RF -Tessalon Perles 200 mg 1 po tid prn cough #60 no RF -Prednisone 20 mg 2 po daily for 5 days #10 no RF, SED -Mucinex -Tylenol/Motrin prn -Rest/fluids -OOW today -RTC precautions -RTC 3-5 days if no improvement  2) Hypertension -Unclear if she has started the additional lisinopril, if not she has bene advised to start this -Blood pressure is still not under optimal control -Advised to either follow up with [place] or PCP in 1 week to address this  Signed, Eula Listen, PA-C 05/08/2013 9:33 AM

## 2013-05-09 ENCOUNTER — Other Ambulatory Visit: Payer: Self-pay | Admitting: Physician Assistant

## 2013-06-01 ENCOUNTER — Encounter: Payer: Self-pay | Admitting: Sports Medicine

## 2013-06-01 ENCOUNTER — Ambulatory Visit (INDEPENDENT_AMBULATORY_CARE_PROVIDER_SITE_OTHER): Payer: BC Managed Care – PPO | Admitting: Sports Medicine

## 2013-06-01 VITALS — BP 135/79 | HR 72 | Temp 99.2°F | Ht 68.5 in | Wt 156.0 lb

## 2013-06-01 DIAGNOSIS — J069 Acute upper respiratory infection, unspecified: Secondary | ICD-10-CM | POA: Insufficient documentation

## 2013-06-01 DIAGNOSIS — J309 Allergic rhinitis, unspecified: Secondary | ICD-10-CM | POA: Insufficient documentation

## 2013-06-01 DIAGNOSIS — I10 Essential (primary) hypertension: Secondary | ICD-10-CM

## 2013-06-01 HISTORY — DX: Allergic rhinitis, unspecified: J30.9

## 2013-06-01 MED ORDER — FLUTICASONE PROPIONATE 50 MCG/ACT NA SUSP: 2.0000 | Freq: Every day | NASAL | Status: DC

## 2013-06-01 MED ORDER — LISINOPRIL 20 MG PO TABS: 20.0000 mg | ORAL_TABLET | Freq: Every day | ORAL | Status: DC

## 2013-06-01 NOTE — Assessment & Plan Note (Signed)
Refilled her lisinopril. Followup with new PCP for further refills and to discuss other ongoing healthcare maintenance

## 2013-06-01 NOTE — Assessment & Plan Note (Signed)
Represcribed Flonase. Instructed on use per AVS

## 2013-06-01 NOTE — Assessment & Plan Note (Signed)
He viral etiology likely.  A symptomatic treatment.  Continue allergic rhinitis treatment.

## 2013-06-01 NOTE — Patient Instructions (Addendum)
It was nice to see you today.   Today we discussed: 1. Acute upper respiratory infections of unspecified site Please use your: - fluticasone (FLONASE) 50 MCG/ACT nasal spray; Place 2 sprays into the nose daily.  Dispense: 16 g; Refill: 0  Remember that you should low your nose really well after using saline prior to your Flonase.  Take your Flonase and cross hands and used to sprays while in healing.  After you use your Flonase right your mouth well.  You should take his medication on a daily basis.   2. HYPERTENSION, BENIGN SYSTEMIC Please continue - lisinopril (PRINIVIL,ZESTRIL) 20 MG tablet; Take 1 tablet (20 mg total) by mouth daily.  Dispense: 30 tablet; Refill: 0   Please plan to return to see Dr. Marylu Lund in 1 month to discuss your blood pressure ongoing health maintenance.  If you need anything prior to seeing me please call the clinic.  Please Bring all medications with you to each appointment.

## 2013-06-01 NOTE — Progress Notes (Signed)
  [facility]  Patient name: Lynn Conley MRN [id number]  Date of birth: Aug 15, 1961  CC & HPI:  Lynn Conley is a 52 y.o. female presenting today for:  # Acute RESPIRATORY Symptoms: Major Sxs:  nasal congestion, frequent throat clearing, ear pressure   Character  nonproductive cough,   Onset  today's   Fevers/Chills  no  Rigors  no  N/V  no  Diarrhea  no  Weight Loss  no  Sick Contacts  no  Other  patient was previously seen last month in urgent care for similar symptoms.  Prescribed Flonase however reportedly did not seem to be helping and discontinued it due to discomfort   Therapy Tried  none currently    #  Hypertension - chronic problem, adequately controlled.    no orthostasis,   no peripheral edema  no chest pain, no dyspnea on exertion, no orthopnea/PND  no episodes of unilateral weakness, dysarthria or acute visual changes  Reports good compliance with her lisinopril    ROS:  Per history of present illness  Pertinent History Reviewed:  Medical & Surgical Hx:  Reviewed: Significant for hypertension Medications: Reviewed & Updated - see associated section Social History: Reviewed -  reports that she has never smoked. She does not have any smokeless tobacco history on file.  Objective Findings:  Vitals: BP 135/79  Pulse 72  Temp(Src) 99.2 F (37.3 C) (Oral)  Ht 5' 8.5" (1.74 m)  Wt 156 lb (70.761 kg)  BMI 23.37 kg/m2  LMP 08/23/2011  PE: GENERAL: Adult thin AA  female. In no discomfort; no respiratory distress  PSYCH: Alert and appropriately interactive Insight Mood Affect  Good euthymic appropriate    HNEENT:  AT/[place], trachea midline MMM, no scleral icterus, no conjunctival exudate H&N: AT/[place], trachea midline  Eyes: no scleral icterus, no exudate  Ears:  right-sided cerumen impaction, after cleaning and irrigating there is only mild erythema with some middle ear effusion no pus.  Left ear without erythema and serous  effusion   Oropharynx: MMM, mild tonsillar hypertrophy   Dentention:   Nose:     CARDIO:  Rate & Rhythm Cardiac Sounds Murmurs  RRR S1/S2, no click appreciated  NO murmur    LUNGS:  CTA B, no wheezes, no crackles  ABDOMEN:    EXTREM:   GU:   SKIN:  no skin lesions   NEUROMSK:      Assessment & Plan:

## 2013-07-20 ENCOUNTER — Other Ambulatory Visit: Payer: Self-pay | Admitting: Sports Medicine

## 2013-07-24 NOTE — Telephone Encounter (Signed)
LVM informing patient that RX was sent in and that she needs to make appointment for future refills.

## 2013-10-03 ENCOUNTER — Ambulatory Visit (INDEPENDENT_AMBULATORY_CARE_PROVIDER_SITE_OTHER): Payer: BC Managed Care – PPO | Admitting: Family Medicine

## 2013-10-03 ENCOUNTER — Encounter: Payer: Self-pay | Admitting: Family Medicine

## 2013-10-03 VITALS — BP 190/110 | HR 71 | Temp 97.6°F | Ht 68.0 in | Wt 158.0 lb

## 2013-10-03 DIAGNOSIS — M79641 Pain in right hand: Secondary | ICD-10-CM | POA: Insufficient documentation

## 2013-10-03 DIAGNOSIS — M79642 Pain in left hand: Secondary | ICD-10-CM | POA: Insufficient documentation

## 2013-10-03 DIAGNOSIS — M79609 Pain in unspecified limb: Secondary | ICD-10-CM

## 2013-10-03 DIAGNOSIS — I10 Essential (primary) hypertension: Secondary | ICD-10-CM

## 2013-10-03 LAB — RHEUMATOID FACTOR: Rhuematoid fact SerPl-aCnc: <10 IU/mL (ref <=14)

## 2013-10-03 LAB — CBC
HCT: 40.5 % (ref 36.0–46.0)
Hemoglobin: 13.8 g/dL (ref 12.0–15.0)
MCH: 30.8 pg (ref 26.0–34.0)
MCHC: 34.1 g/dL (ref 30.0–36.0)
RBC: 4.48 MIL/uL (ref 3.87–5.11)

## 2013-10-03 LAB — COMPREHENSIVE METABOLIC PANEL
Albumin: 4.4 g/dL (ref 3.5–5.2)
BUN: 12 mg/dL (ref 6–23)
CO2: 28 mEq/L (ref 19–32)
Glucose, Bld: 81 mg/dL (ref 70–99)
Potassium: 4.1 mEq/L (ref 3.5–5.3)
Sodium: 139 mEq/L (ref 135–145)
Total Bilirubin: 0.5 mg/dL (ref 0.3–1.2)
Total Protein: 7 g/dL (ref 6.0–8.3)

## 2013-10-03 LAB — POCT SEDIMENTATION RATE: POCT SED RATE: 6 mm/hr (ref 0–22)

## 2013-10-03 MED ORDER — LISINOPRIL 20 MG PO TABS: ORAL_TABLET | ORAL | Status: DC

## 2013-10-03 MED ORDER — LISINOPRIL-HYDROCHLOROTHIAZIDE 20-12.5 MG PO TABS: 1.0000 | ORAL_TABLET | Freq: Every day | ORAL | Status: DC

## 2013-10-03 MED ORDER — PREDNISONE 20 MG PO TABS: 40.0000 mg | ORAL_TABLET | Freq: Every day | ORAL | Status: DC

## 2013-10-03 NOTE — Assessment & Plan Note (Signed)
GIven duration (5 months), morning stiffness, and distribution of joints (primarily MCPs) suspect possible Rheumatoid Arthritis. Will evaluate with ESR, CRP, RF, ANA and bilateral hadn radiographs. Will also get CBC and CMET.   Will treat short term with prednisone 40mg  Daily x 7 days. Differential includes osteoarthritis, viral arthropathy (doubt given duration), gout (no history and does not appear to be gouty arthritis on exam), pseudogout. Have asked patient to follow up with PCP.

## 2013-10-03 NOTE — Progress Notes (Signed)
  Lynn Conch, MD Phone: [phone]  Subjective:  Chief complaint-noted  # Hand Pain Patient complains of progressive bilateral hand pain over the last 5 months. Patient's pain is most dominant in her right 1st MCP and interphalangeal joint. She also has pain in all MCPs of both hands as well as pain in her IP joint on her left hand though not as severe as the right hand. Patient is right handed. Complains of hand stiffness in the morning lasting about 30 minutes. She feels like her joints will swell up from time to time especially her right thumb. 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 She has essentially stopped moving her MCP or IP on her right thumb (or restricting as much as possible). She has not come into clinic before now she states because of finances but the pain was so severe that she felt she must come in.  ROS-no fevers/chills/joint pain in other areas than her hand. Denies skin changes or scaling.   # Hypertension BP Readings from Last 3 Encounters:  10/03/13 210/111  06/01/13 135/79  05/08/13 152/92  Home BP monitoring-no Compliant with medications-no, ran out of medicines several months ago Denies any CP, HA, SOB, blurry vision, LE edema, transient weakness, orthopnea, PND.   Past Medical History-history of anemia and hypertension Family History-denies history of rheumatoid arthritis.   Medications- reviewed and updated Current Outpatient Prescriptions on File Prior to Visit  Medication Sig Dispense Refill  . fluticasone (FLONASE) 50 MCG/ACT nasal spray Place 2 sprays into the nose daily.  16 g  0   No current facility-administered medications on file prior to visit.    Objective: BP 190/110  Pulse 71  Temp(Src) 97.6 F (36.4 C) (Oral)  Ht 5\' 8"  (1.727 m)  Wt 158 lb (71.668 kg)  BMI 24.03 kg/m2  LMP 08/23/2011 Gen: NAD, resting comfortably in chair CV: RRR no murmurs rubs or gallops Lungs: CTAB no  crackles, wheeze, rhonchi Skin: warm, dry, no rash Neuro: grossly normal, moves all extremities MSK: Patient with mild swelling over IP and MCP of right thumb and also very tender in these areas. Pain with deep palpation over MCP of both hands. No warmth or redness around the joint. Able to passively flex all joints but with IP and MCP cause severe pain.   Assessment/Plan:

## 2013-10-03 NOTE — Assessment & Plan Note (Signed)
Poorly controlled (malignant) off of therapy. Asymptomatic. Restarted lisinopril but added hctz for combination pill, may need to take 2 of these in the future. WIll have patient back for a blood pressure check on Monday and further titrate up at that time . Check BMET today to make sure kidney function is tolerating increased blood pressure.

## 2013-10-03 NOTE — Patient Instructions (Addendum)
I am concerned about the possibility of rheumatoid arthritis. i am getting lab tests and imagining of your hand. Take prednisone to see if that helps your pain.   For your blood pressure, I gave you a combination pill lisinopril-hctz. I want you to have your blood pressure rechecked on Monday (make a nurse visit).   Thanks, Dr. Durene Cal  P.S. Go ahead and schedule a visit with Dr. Caleb Popp at his next available so you can get plugged back in. You definitely need to be seen within a month.

## 2013-10-04 LAB — ANA: Anti Nuclear Antibody(ANA): NEGATIVE

## 2013-10-08 ENCOUNTER — Telehealth: Payer: Self-pay | Admitting: *Deleted

## 2013-10-08 ENCOUNTER — Ambulatory Visit (INDEPENDENT_AMBULATORY_CARE_PROVIDER_SITE_OTHER): Payer: BC Managed Care – PPO | Admitting: *Deleted

## 2013-10-08 VITALS — BP 158/100 | HR 98

## 2013-10-08 DIAGNOSIS — I10 Essential (primary) hypertension: Secondary | ICD-10-CM

## 2013-10-08 NOTE — Telephone Encounter (Signed)
LMOVM for pt to return call. Yanira Tolsma, Shanda Bumps Dawn   Pt has BP check this afternoon @ 2pm.  Will also send to RN. Kasiyah Platter, Maryjo Rochester

## 2013-10-08 NOTE — Telephone Encounter (Signed)
Message copied by Osborne Oman on Mon Oct 08, 2013 12:00 PM ------      Message from: Shelva Majestic      Created: Mon Oct 08, 2013 10:58 AM       Please inform patient that workup for rheumatoid arthritis was essentially negative. I would still encourage her to get the hand-xyras which she has not done yet. THis is likely osteoarthritis and she should follow up with her PCP for long term management as I think NSAIDs or tylenol would be preferred over the prednisone I gave her for long term care. ------

## 2013-10-08 NOTE — Progress Notes (Signed)
Patient in today for blood pressure check. Blood pressure taken manually in left arm measured at 158/100. Patient reports regular taking of blood pressure medications since last office visit. Pharmacy and patient phone number verified should PCP want to change/increase medicines. Will forward to PCP.

## 2013-10-09 NOTE — Telephone Encounter (Signed)
Relayed message to patient at blood pressure check yesterday, patient expressed understanding.

## 2013-10-17 ENCOUNTER — Telehealth: Payer: Self-pay | Admitting: Family Medicine

## 2013-10-17 NOTE — Telephone Encounter (Signed)
Called Ms. Ivy regarding her results. She stated that she was already aware. She states that she is scheduled to get her X-ray on Friday 11/28. The prednisone helped her symptoms and I suggested ibuprofen or tylenol for continued pain relief. She is scheduled to see me on 12/15 and I will follow-up with her at that time.

## 2013-11-05 ENCOUNTER — Ambulatory Visit (INDEPENDENT_AMBULATORY_CARE_PROVIDER_SITE_OTHER): Payer: BC Managed Care – PPO | Admitting: Family Medicine

## 2013-11-05 ENCOUNTER — Ambulatory Visit (HOSPITAL_COMMUNITY)
Admission: RE | Admit: 2013-11-05 | Discharge: 2013-11-05 | Disposition: A | Discharge status: Home / Self Care | Payer: BC Managed Care – PPO | Source: Ambulatory Visit | Attending: Family Medicine | Admitting: Family Medicine

## 2013-11-05 ENCOUNTER — Encounter: Payer: Self-pay | Admitting: Family Medicine

## 2013-11-05 VITALS — BP 155/90 | HR 76 | Temp 98.4°F | Ht 68.0 in | Wt 159.0 lb

## 2013-11-05 DIAGNOSIS — M79609 Pain in unspecified limb: Secondary | ICD-10-CM

## 2013-11-05 DIAGNOSIS — M79641 Pain in right hand: Secondary | ICD-10-CM

## 2013-11-05 DIAGNOSIS — M19049 Primary osteoarthritis, unspecified hand: Secondary | ICD-10-CM | POA: Insufficient documentation

## 2013-11-05 DIAGNOSIS — I10 Essential (primary) hypertension: Secondary | ICD-10-CM

## 2013-11-05 LAB — BASIC METABOLIC PANEL
BUN: 15 mg/dL (ref 6–23)
Calcium: 9.9 mg/dL (ref 8.4–10.5)
Creat: 0.72 mg/dL (ref 0.50–1.10)
Glucose, Bld: 108 mg/dL — ABNORMAL HIGH (ref 70–99)

## 2013-11-05 MED ORDER — LISINOPRIL-HYDROCHLOROTHIAZIDE 20-12.5 MG PO TABS: 2.0000 | ORAL_TABLET | Freq: Every day | ORAL | Status: DC

## 2013-11-05 NOTE — Addendum Note (Signed)
Addended by: Jimmy Footman K on: 11/05/2013 11:40 AM   Modules accepted: Orders

## 2013-11-05 NOTE — Patient Instructions (Signed)
Lynn Conley, it was a pleasure meeting you today. Today we talked about your X-rays of your hands and how to manage your hand pain/stiffness. We will check another lab today to see if you could possibly have rheumatoid arthritis. In the mean time, I would like you to take the aleve or motrin for your pain. Regarding your blood pressure, I will increase your mediation so you take it twice daily. I will also get labs to make sure your kidney function is okay since restarting your lisinopril last month. If you have any questions, please do not hesitate to call. Please see me in one month for follow-up.  Sincerely, Jacquelin Hawking, MD

## 2013-11-05 NOTE — Addendum Note (Signed)
Addended by: Jimmy Footman K on: 11/05/2013 11:43 AM   Modules accepted: Orders

## 2013-11-06 NOTE — Progress Notes (Signed)
   Subjective:    Patient ID: Lynn Conley, female    DOB: 07/05/1961, 52 y.o.   MRN: [id number]  HPI Right hand pain with bilateral hand stiffness Lynn Conley is following up from a previous visit last month. Her hand pain has now been going on for 6 months. Her pain is located in the 1-5 MCP and the 1st interphalangeal joint. The pain is worst in her first digit with associated stiffness and decreased range of motion. She also has bilateral hand stiffness that is worst in the morning and improves after about 30 minutes. Her pain also worsens at work. 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 She has taken Aleve which has not helped. She takes one pill per day.  Hypertension High today. Patient is taking medication as prescribed. She has not had any headaches, blurry/change in vision, chest pain or leg swelling.  Review of Systems  Constitutional: Negative for fever.  Cardiovascular: Negative for chest pain and leg swelling.  Neurological: Negative for headaches.       Objective:   Physical Exam  Constitutional: She is oriented to person, place, and time. She appears well-developed and well-nourished.  Cardiovascular: Normal rate and normal heart sounds.   Pulmonary/Chest: Effort normal and breath sounds normal. No respiratory distress.  Musculoskeletal:  Right first MCP is slightly swollen with significant tenderness and right first ICP is tender. Right 2-5 MCPs are tender. No erythema on any joints. No deformities noted. Left MCPs are non-tender, non-erythematous and not swollen.  Neurological: She is alert and oriented to person, place, and time.    Dg Hand Complete Left/Right  11/05/2013   CLINICAL DATA:  Bilateral hand pain.  EXAM: RIGHT HAND - COMPLETE 3+ VIEW; LEFT HAND - COMPLETE 3+ VIEW  COMPARISON:  None.  FINDINGS: Right hand: There is no evidence of fracture or dislocation. There are mild degenerative changes of the 2nd DIP  joint. There is no other focal bony abnormality. Soft tissues are unremarkable.  Left hand: There is no evidence of fracture or dislocation. There is no evidence of arthropathy or other focal bone abnormality. Soft tissues are unremarkable.    IMPRESSION:  1.  No acute osseous injury of the right hand.  2.  No acute osseous injury of the left hand.   3.  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    Electronically Signed   By: Elige Ko   On: 11/05/2013 13:45       Assessment & Plan:

## 2013-11-07 NOTE — Assessment & Plan Note (Addendum)
Will follow-up Bmet to check kidney function since patient was restarted on lisinopril. Will increase to BID dosing of lisinopril-hydrochlorothiazide to better control BPs.

## 2013-11-07 NOTE — Assessment & Plan Note (Signed)
Presentation is similar to rheumatoid arthritis, however, ESR, ANA and Rh labs do not suggest this diagnosis. Osteoarthritis less likely due to inconsistent history and  x-ray not endorsing diagnosis. Will get anti CCP as another test for Rheumatoid arthritis.

## 2014-01-27 ENCOUNTER — Other Ambulatory Visit: Payer: Self-pay | Admitting: Sports Medicine

## 2014-07-25 ENCOUNTER — Other Ambulatory Visit: Payer: Self-pay | Admitting: Family Medicine

## 2014-07-26 NOTE — Telephone Encounter (Signed)
Spoke with patient and informed her of below. She will make an appointment to see PCP so that future refills can be made

## 2014-07-26 NOTE — Telephone Encounter (Signed)
Please have patient follow-up when she is able for future refills

## 2015-01-30 ENCOUNTER — Ambulatory Visit (INDEPENDENT_AMBULATORY_CARE_PROVIDER_SITE_OTHER): Payer: BC Managed Care – PPO | Admitting: Sports Medicine

## 2015-01-30 VITALS — BP 178/104 | HR 77 | Temp 98.3°F | Resp 16 | Ht 67.0 in | Wt 157.5 lb

## 2015-01-30 DIAGNOSIS — J309 Allergic rhinitis, unspecified: Secondary | ICD-10-CM

## 2015-01-30 DIAGNOSIS — R011 Cardiac murmur, unspecified: Secondary | ICD-10-CM | POA: Diagnosis not present

## 2015-01-30 DIAGNOSIS — I10 Essential (primary) hypertension: Secondary | ICD-10-CM | POA: Diagnosis not present

## 2015-01-30 HISTORY — DX: Cardiac murmur, unspecified: R01.1

## 2015-01-30 MED ORDER — FLUTICASONE PROPIONATE 50 MCG/ACT NA SUSP: 2.0000 | Freq: Every day | NASAL | Status: DC

## 2015-01-30 NOTE — Progress Notes (Signed)
  Jeanella CrazeWanda K Scrogham - 54 y.o. female MRN [id number]  Date of birth: Nov 27, 1960  SUBJECTIVE: Chief Complaint  Patient presents with  . Sore Throat  . Ear Pain  . Sinus Drainage  . Nasal Congestion    HPI:  1 day history of the above symptoms.  Denies fevers, chills, cough or malaise  No difficulty swallowing.  Has not seen PCP in greater than one year.  Denies any chest pain, shortness of breath, dizziness, lightheadedness.  No change in functional status.  No lower extremity swelling, orthopnea, PND or tachycardia palpitations Pt denies chest pain, palpitations, shortness of breath/DOE, orthopnea/PND, LE swelling.  Not taking medications other than cough drops   ROS: per HPI    HISTORY:  Past Medical, Surgical, Social, and Family History reviewed & updated per EMR.  Pertinent Historical Findings include:  reports that she has never smoked. She has never used smokeless tobacco. Hypertension, iron deficient anemia, menorrhagia, allergic rhinitis, systolic ejection murmur  Historical Data Reviewed: No prior cardiovascular evaluation   OBJECTIVE:  VS:   HT:5\' 7"  (170.2 cm)   WT:157 lb 8 oz (71.442 kg)  BMI:24.7          BP:(!) 178/104 mmHg  HR:77bpm  TEMP:98.3 F (36.8 C)(Oral)  RESP:98 %  Physical Exam  Constitutional: She is well-developed, well-nourished, and in no distress.  Non-toxic appearance. She does not have a sickly appearance.  HENT:  Head: Normocephalic and atraumatic.  Right Ear: External ear and ear canal normal. A middle ear effusion is present.  Left Ear: External ear and ear canal normal. No drainage.  No middle ear effusion.  Mouth/Throat: No oropharyngeal exudate.  Eyes: Conjunctivae and EOM are normal. Pupils are equal, round, and reactive to light. Right eye exhibits no discharge. Left eye exhibits no discharge. No scleral icterus.  Neck: Normal range of motion. Neck supple. No JVD present. No tracheal deviation present. No thyromegaly present.    Cardiovascular: Normal rate and regular rhythm.  Exam reveals no gallop and no friction rub.   Murmur (Systolic ejection murmur heard best at right upper sternal border. No displaced PMI) heard. Pulmonary/Chest: Effort normal and breath sounds normal. No respiratory distress. She has no wheezes. She exhibits no tenderness.  Abdominal: Soft. She exhibits no distension and no mass. There is no tenderness. There is no rebound and no guarding.  Musculoskeletal: She exhibits no edema or tenderness.  Lymphadenopathy:    She has no cervical adenopathy.  Neurological: She is alert.  Moves all 4 extremities spontaneously; no lateralization.  Skin: Skin is warm and dry. She is not diaphoretic.  Psychiatric: Mood, memory, affect and judgment normal.  Vitals reviewed.   ASSESSMENT: 1. Allergic rhinitis, unspecified allergic rhinitis type   2. Systolic ejection murmur   3. HYPERTENSION, BENIGN SYSTEMIC     Problem  Systolic Ejection Murmur   Noted during urgent care visit, blood pressure elevated at the time. Recommend follow-up with with her PCP to consider further evaluation. No prior cardiac symptoms & unrelated to head and neck symptoms at urgent care.Marland Kitchen.     PLAN: See problem based charting & AVS for additional documentation.  Symptoms consistent with allergic rhinitis  Restart Flonase  Follow-up with PCP as soon as possible for health maintenance including hypertension and cardiovascular disease monitoring. > Return if symptoms worsen or fail to improve.

## 2015-01-30 NOTE — Patient Instructions (Signed)
Follow up with Dr. Mal MistyNetty ASAP to discuss your ongoing Health Maintenance.  You have viral infection that will resolve on its own over time.  Typically, symptoms can last for up to 10 days (or more) but should be continually improving after the 5th day.  If you exerpience worsening after the 7th day please call us back.  Please continue to drink plenty of fluids and remember you and everybody in your household need to wash their hands frequently!  Take acetaminophen (Tylenol) 1X500mg  or 2X325mg  tablets every 8 hours  Take Ibuprofen (Advil or Motrin) 2-3 200mg  tablets every 8 hours  Try to alternate the acetaminophen and ibuprofen so that you take one every 4 hours Honey has been shown to help with cough.  You can try mixing  a teaspoon of honey in warm water or caffeine free herbal tea before bedtime. We don't know why, but chicken soup also helps, try it!

## 2015-02-01 NOTE — Addendum Note (Signed)
Addended by: Gaspar BiddingIGBY, MICHAEL D on: 02/01/2015 08:25 AM   Modules accepted: Level of Service

## 2015-03-27 ENCOUNTER — Encounter: Payer: Self-pay | Admitting: Family Medicine

## 2015-03-27 ENCOUNTER — Ambulatory Visit (INDEPENDENT_AMBULATORY_CARE_PROVIDER_SITE_OTHER): Payer: BC Managed Care – PPO | Admitting: Family Medicine

## 2015-03-27 VITALS — BP 164/98 | HR 74 | Ht 67.0 in | Wt 160.0 lb

## 2015-03-27 DIAGNOSIS — Z0000 Encounter for general adult medical examination without abnormal findings: Secondary | ICD-10-CM | POA: Diagnosis not present

## 2015-03-27 DIAGNOSIS — Z1239 Encounter for other screening for malignant neoplasm of breast: Secondary | ICD-10-CM

## 2015-03-27 DIAGNOSIS — Z1211 Encounter for screening for malignant neoplasm of colon: Secondary | ICD-10-CM

## 2015-03-27 DIAGNOSIS — R7309 Other abnormal glucose: Secondary | ICD-10-CM

## 2015-03-27 DIAGNOSIS — I10 Essential (primary) hypertension: Secondary | ICD-10-CM

## 2015-03-27 LAB — COMPREHENSIVE METABOLIC PANEL
ALT: 13 U/L (ref 0–35)
AST: 18 U/L (ref 0–37)
Albumin: 4.4 g/dL (ref 3.5–5.2)
Alkaline Phosphatase: 49 U/L (ref 39–117)
BILIRUBIN TOTAL: 0.5 mg/dL (ref 0.2–1.2)
BUN: 17 mg/dL (ref 6–23)
CO2: 28 meq/L (ref 19–32)
CREATININE: 0.79 mg/dL (ref 0.50–1.10)
Calcium: 9.4 mg/dL (ref 8.4–10.5)
Chloride: 102 mEq/L (ref 96–112)
Glucose, Bld: 83 mg/dL (ref 70–99)
Potassium: 3.7 mEq/L (ref 3.5–5.3)
Sodium: 139 mEq/L (ref 135–145)
Total Protein: 6.7 g/dL (ref 6.0–8.3)

## 2015-03-27 LAB — CBC WITH DIFFERENTIAL/PLATELET
BASOS PCT: 0 % (ref 0–1)
Basophils Absolute: 0 10*3/uL (ref 0.0–0.1)
EOS ABS: 0 10*3/uL (ref 0.0–0.7)
EOS PCT: 1 % (ref 0–5)
HEMATOCRIT: 39.7 % (ref 36.0–46.0)
Hemoglobin: 13.1 g/dL (ref 12.0–15.0)
LYMPHS ABS: 1.6 10*3/uL (ref 0.7–4.0)
Lymphocytes Relative: 44 % (ref 12–46)
MCH: 30.5 pg (ref 26.0–34.0)
MCHC: 33 g/dL (ref 30.0–36.0)
MCV: 92.5 fL (ref 78.0–100.0)
MPV: 9.9 fL (ref 8.6–12.4)
Monocytes Absolute: 0.5 10*3/uL (ref 0.1–1.0)
Monocytes Relative: 14 % — ABNORMAL HIGH (ref 3–12)
Neutro Abs: 1.5 10*3/uL — ABNORMAL LOW (ref 1.7–7.7)
Neutrophils Relative %: 41 % — ABNORMAL LOW (ref 43–77)
Platelets: 234 10*3/uL (ref 150–400)
RBC: 4.29 MIL/uL (ref 3.87–5.11)
RDW: 13.3 % (ref 11.5–15.5)
WBC: 3.6 10*3/uL — ABNORMAL LOW (ref 4.0–10.5)

## 2015-03-27 LAB — POCT GLYCOSYLATED HEMOGLOBIN (HGB A1C): Hemoglobin A1C: 5.5

## 2015-03-27 LAB — LIPID PANEL
Cholesterol: 219 mg/dL — ABNORMAL HIGH (ref 0–200)
HDL: 119 mg/dL (ref >=46)
LDL CALC: 93 mg/dL (ref 0–99)
TRIGLYCERIDES: 35 mg/dL (ref <150)
Total CHOL/HDL Ratio: 1.8 Ratio
VLDL: 7 mg/dL (ref 0–40)

## 2015-03-27 LAB — TSH: TSH: 0.73 u[IU]/mL (ref 0.350–4.500)

## 2015-03-27 MED ORDER — LISINOPRIL-HYDROCHLOROTHIAZIDE 20-12.5 MG PO TABS: 2.0000 | ORAL_TABLET | Freq: Every day | ORAL | Status: DC

## 2015-03-27 NOTE — Patient Instructions (Signed)
Thank you for coming to see me today. It was a pleasure. Today we talked about:   High blood pressure: No changes to medications. Please remember to take it daily.  Please make an appointment to see me in 3 months for follow-up.  If you have any questions or concerns, please do not hesitate to call the office at [phone].  Sincerely,  Jacquelin Hawkingalph Athanasia Stanwood, MD

## 2015-03-27 NOTE — Progress Notes (Signed)
    Subjective    Lynn Conley is a 54 y.o. female that presents for a preventive care visit.   HPI:  1. Hypertension: Adherent with medication regimen, although did not take medication this morning. No chest pain, shortness of breath or headaches.  Past Medical History  Diagnosis Date  . Hypertension     History reviewed. No pertinent past surgical history.  Current Outpatient Prescriptions on File Prior to Visit  Medication Sig Dispense Refill  . fluticasone (FLONASE) 50 MCG/ACT nasal spray Place 2 sprays into both nostrils daily. 16 g 1   No current facility-administered medications on file prior to visit.    No Known Allergies  History   Social History  . Marital Status: Single    Spouse Name: N/A  . Number of Children: N/A  . Years of Education: N/A   Social History Main Topics  . Smoking status: Never Smoker   . Smokeless tobacco: Never Used  . Alcohol Use: 0.6 oz/week    0 Standard drinks or equivalent, 1 Glasses of wine per week  . Drug Use: No  . Sexual Activity:    Partners: Male    Birth Control/ Protection: None   Other Topics Concern  . None   Social History Narrative    Family History  Problem Relation Age of Onset  . Hyperlipidemia Mother   . Hypertension Mother   . Hypertension Sister   . Heart attack Father     ROS  Per HPI   Objective   BP 164/98 mmHg  Pulse 74  Ht 5\' 7"  (1.702 m)  Wt 160 lb (72.576 kg)  BMI 25.05 kg/m2  LMP 08/23/2011  General: Well appearing female in no distress HEENT: TMs clear, nose appears normal, oropharynx clear and moist, no cervical adenopathy Respiratory/Chest: Clear to auscultation bilaterally, no wheezing or rhonchi Cardiovascular: Regular rate and rhythm, 1/6 early systolic murmur heard best at RUSB with no radiation Gastrointestinal: Soft, non-tender, non-distended Genitourinary: Not examined    Musculoskeletal: Moves extremities, no calf tenderness, no edema Neuro: Alert, oriented, CN  intact, 5/5 strength throughout Dermatologic: Seborrheic keratoses located on face bilaterally Psychiatric: Normal affect, dressed appropriately  Meds ordered this encounter  Medications  . lisinopril-hydrochlorothiazide (PRINZIDE,ZESTORETIC) 20-12.5 MG per tablet    Sig: Take 2 tablets by mouth daily.    Dispense:  180 tablet    Refill:  3    **Patient requests 90 days supply**    Assessment and Plan    Preventive visit  CBC, Cmet, TSH, A1C, Lipid panel  Mammogram  Colonoscopy  Please refer to problem based charting of assessment and plan

## 2015-03-28 NOTE — Assessment & Plan Note (Signed)
Blood pressure not controlled today, although patient has not taken medication this morning. No red flags  Continue lisinopril-hctz 20-12.5 2 tabs qD

## 2015-04-04 ENCOUNTER — Ambulatory Visit
Admission: RE | Admit: 2015-04-04 | Discharge: 2015-04-04 | Disposition: A | Discharge status: Home / Self Care | Payer: BC Managed Care – PPO | Source: Ambulatory Visit | Attending: Family Medicine | Admitting: Family Medicine

## 2015-04-04 DIAGNOSIS — Z1239 Encounter for other screening for malignant neoplasm of breast: Secondary | ICD-10-CM

## 2015-04-28 ENCOUNTER — Encounter: Payer: Self-pay | Admitting: Internal Medicine

## 2015-05-16 ENCOUNTER — Ambulatory Visit (AMBULATORY_SURGERY_CENTER): Payer: Self-pay | Admitting: *Deleted

## 2015-05-16 VITALS — Ht 67.0 in | Wt 160.0 lb

## 2015-05-16 DIAGNOSIS — Z1211 Encounter for screening for malignant neoplasm of colon: Secondary | ICD-10-CM

## 2015-05-16 MED ORDER — NA SULFATE-K SULFATE-MG SULF 17.5-3.13-1.6 GM/177ML PO SOLN: 1.0000 | Freq: Once | ORAL | Status: DC

## 2015-05-16 NOTE — Progress Notes (Signed)
No egg or soy allergy. No anesthesia problems.  No home O2.  No diet meds.  

## 2015-05-30 ENCOUNTER — Encounter: Payer: Self-pay | Admitting: Internal Medicine

## 2015-05-30 ENCOUNTER — Ambulatory Visit (AMBULATORY_SURGERY_CENTER): Payer: BC Managed Care – PPO | Admitting: Internal Medicine

## 2015-05-30 VITALS — BP 146/93 | HR 63 | Temp 97.3°F | Resp 25 | Ht 67.0 in | Wt 160.0 lb

## 2015-05-30 DIAGNOSIS — Z1211 Encounter for screening for malignant neoplasm of colon: Secondary | ICD-10-CM | POA: Diagnosis not present

## 2015-05-30 MED ORDER — SODIUM CHLORIDE 0.9 % IV SOLN: 500.0000 mL | INTRAVENOUS | Status: DC

## 2015-05-30 NOTE — Op Note (Signed)
 [facility] [address] [place], [id number]   COLONOSCOPY PROCEDURE REPORT  PATIENT: Lynn Conley, Lynn Conley  MR#: [id number] BIRTHDATE: 1961/06/08 , 53  yrs. old GENDER: female ENDOSCOPIST: Hart Carwinora M Brodie, MD REFERRED BY:SDr Jacquelin Hawkingalph Nettey PROCEDURE DATE:  05/30/2015 PROCEDURE:   Colonoscopy, screening First Screening Colonoscopy - Avg.  risk and is 50 yrs.  old or older Yes.  Prior Negative Screening - Now for repeat screening. N/A  History of Adenoma - Now for follow-up colonoscopy & has been > or = to 3 yrs.  N/A  Polyps removed today? No Recommend repeat exam, <10 yrs? No ASA CLASS:   Class II INDICATIONS:Screening for colonic neoplasia and Colorectal Neoplasm Risk Assessment for this procedure is average risk. MEDICATIONS: Monitored anesthesia care and Propofol 300 mg IV  DESCRIPTION OF PROCEDURE:   After the risks benefits and alternatives of the procedure were thoroughly explained, informed consent was obtained.  The digital rectal exam revealed no abnormalities of the rectum.   The LB [id number]  endoscope was introduced through the anus and advanced to the cecum, which was identified by both the appendix and ileocecal valve. No adverse events experienced.   The quality of the prep was good.  The instrument was then slowly withdrawn as the colon was fully examined. Estimated blood loss is zero unless otherwise noted in this procedure report.      COLON FINDINGS: A normal appearing cecum, ileocecal valve, and appendiceal orifice were identified.  The ascending, transverse, descending, sigmoid colon, and rectum appeared unremarkable. Retroflexed views revealed no abnormalities. The time to cecum = 11.46 Withdrawal time = 5.09   The scope was withdrawn and the procedure completed. COMPLICATIONS: There were no immediate complications.  ENDOSCOPIC IMPRESSION: Normal colonoscopy  RECOMMENDATIONS: High fiber diet Recall colonoscopy in 10  years  eSigned:  Hart Carwinora M Brodie, MD 05/30/2015 9:06 AM   cc:

## 2015-05-30 NOTE — Patient Instructions (Signed)
YOU HAD AN ENDOSCOPIC PROCEDURE TODAY AT THE [facility]:   Refer to the procedure report that was given to you for any specific questions about what was found during the examination.  If the procedure report does not answer your questions, please call your gastroenterologist to clarify.  If you requested that your care partner not be given the details of your procedure findings, then the procedure report has been included in a sealed envelope for you to review at your convenience later.  YOU SHOULD EXPECT: Some feelings of bloating in the abdomen. Passage of more gas than usual.  Walking can help get rid of the air that was put into your GI tract during the procedure and reduce the bloating. If you had a lower endoscopy (such as a colonoscopy or flexible sigmoidoscopy) you may notice spotting of blood in your stool or on the toilet paper. If you underwent a bowel prep for your procedure, you may not have a normal bowel movement for a few days.  Please Note:  You might notice some irritation and congestion in your nose or some drainage.  This is from the oxygen used during your procedure.  There is no need for concern and it should clear up in a day or so.  SYMPTOMS TO REPORT IMMEDIATELY:   Following lower endoscopy (colonoscopy or flexible sigmoidoscopy):  Excessive amounts of blood in the stool  Significant tenderness or worsening of abdominal pains  Swelling of the abdomen that is new, acute  Fever of 100F or higher  For urgent or emergent issues, a gastroenterologist can be reached at any hour by calling [phone].   DIET: Your first meal following the procedure should be a small meal and then it is ok to progress to your normal diet. Heavy or fried foods are harder to digest and may make you feel nauseous or bloated.  Likewise, meals heavy in dairy and vegetables can increase bloating.  Drink plenty of fluids but you should avoid alcoholic beverages for 24  hours.  ACTIVITY:  You should plan to take it easy for the rest of today and you should NOT DRIVE or use heavy machinery until tomorrow (because of the sedation medicines used during the test).    FOLLOW UP: Our staff will call the number listed on your records the next business day following your procedure to check on you and address any questions or concerns that you may have regarding the information given to you following your procedure. If we do not reach you, we will leave a message.  However, if you are feeling well and you are not experiencing any problems, there is no need to return our call.  We will assume that you have returned to your regular daily activities without incident.  If any biopsies were taken you will be contacted by phone or by letter within the next 1-3 weeks.  Please call us at [phone] if you have not heard about the biopsies in 3 weeks.    SIGNATURES/CONFIDENTIALITY: You and/or your care partner have signed paperwork which will be entered into your electronic medical record.  These signatures attest to the fact that that the information above on your After Visit Summary has been reviewed and is understood.  Full responsibility of the confidentiality of this discharge information lies with you and/or your care-partner.  High fiber diet information given. Next colonoscopy 10 [id number]

## 2015-05-30 NOTE — Progress Notes (Signed)
To recovery, report given and VSS. 

## 2015-06-02 ENCOUNTER — Telehealth: Payer: Self-pay | Admitting: *Deleted

## 2015-06-02 NOTE — Telephone Encounter (Signed)
  Follow up Call-  Call back number 05/30/2015  Post procedure Call Back phone  # [phone]  Permission to leave phone message Yes     Patient questions:  Do you have a fever, pain , or abdominal swelling? No. Pain Score  0 *  Have you tolerated food without any problems? Yes.    Have you been able to return to your normal activities? Yes.    Do you have any questions about your discharge instructions: Diet   No. Medications  No. Follow up visit  No.  Do you have questions or concerns about your Care? No.  Actions: * If pain score is 4 or above: No action needed, pain <4.

## 2015-06-23 ENCOUNTER — Ambulatory Visit (INDEPENDENT_AMBULATORY_CARE_PROVIDER_SITE_OTHER): Payer: BC Managed Care – PPO | Admitting: Family Medicine

## 2015-06-23 VITALS — BP 142/94 | HR 80 | Temp 98.4°F | Resp 17 | Ht 67.0 in | Wt 165.0 lb

## 2015-06-23 DIAGNOSIS — M545 Low back pain, unspecified: Secondary | ICD-10-CM

## 2015-06-23 MED ORDER — CYCLOBENZAPRINE HCL 10 MG PO TABS: 10.0000 mg | ORAL_TABLET | Freq: Two times a day (BID) | ORAL | Status: DC | PRN

## 2015-06-23 MED ORDER — MELOXICAM 7.5 MG PO TABS: 7.5000 mg | ORAL_TABLET | Freq: Every day | ORAL | Status: DC

## 2015-06-23 NOTE — Progress Notes (Signed)
[facility] [address], [place] [address] [phone]  Date:  06/23/2015   Name:  Lynn Conley   DOB:  05/30/1961   MRN:  [id number]  PCP:  Jacquelin Hawking, MD    Chief Complaint: Back Pain   History of Present Illness:  Lynn Conley is a 54 y.o. very pleasant female patient who presents with the following:  She is here today with back pain- it is in her right lower back and down her right leg.  She has noted this for a week now. No numbness or tingling in her leg- it aches only.   She noted sudden onset after she twisted in a strange way.   She has never had this in the past.   No urinary sx No bowel or bladder incont OW she feels well So has tried some aleve so far.   She does not have DM  She is post- menopause   Patient Active Problem List   Diagnosis Date Noted  . Systolic ejection murmur 01/30/2015  . Bilateral hand pain 10/03/2013  . Allergic rhinitis 06/01/2013  . MENORRHAGIA 02/03/2009  . ANEMIA, IRON DEFICIENCY, UNSPEC. 01/19/2007  . HYPERTENSION, BENIGN SYSTEMIC 01/19/2007    Past Medical History  Diagnosis Date  . Hypertension     No past surgical history on file.  History  Substance Use Topics  . Smoking status: Never Smoker   . Smokeless tobacco: Never Used  . Alcohol Use: 0.6 oz/week    1 Glasses of wine, 0 Standard drinks or equivalent per week    Family History  Problem Relation Age of Onset  . Hyperlipidemia Mother   . Hypertension Mother   . Hypertension Sister   . Heart attack Father   . Colon cancer Neg Hx     No Known Allergies  Medication list has been reviewed and updated.  Current Outpatient Prescriptions on File Prior to Visit  Medication Sig Dispense Refill  . lisinopril-hydrochlorothiazide (PRINZIDE,ZESTORETIC) 20-12.5 MG per tablet Take 2 tablets by mouth daily. 180 tablet 3   No current facility-administered medications on file prior to visit.    Review of Systems:  As per HPI-  otherwise negative.   Physical Examination: Filed Vitals:   06/23/15 1557  BP: 142/94  Pulse: 80  Temp: 98.4 F (36.9 C)  Resp: 17   Filed Vitals:   06/23/15 1557  Height: 5\' 7"  (1.702 m)  Weight: 165 lb (74.844 kg)   Body mass index is 25.84 kg/(m^2). Ideal Body Weight: Weight in (lb) to have BMI = 25: 159.3  GEN: WDWN, NAD, Non-toxic, A & O x 3, looks well HEENT: Atraumatic, Normocephalic. Neck supple. No masses, No LAD. Ears and Nose: No external deformity. CV: RRR, No M/G/R. No JVD. No thrill. No extra heart sounds. PULM: CTA B, no wheezes, crackles, rhonchi. No retractions. No resp. distress. No accessory muscle use. EXTR: No c/c/e NEURO Normal gait.  PSYCH: Normally interactive. Conversant. Not depressed or anxious appearing.  Calm demeanor.  She notes pain in her right buttock and lower back with flexion of her spine Normal spine extension Cannot reproduce tenderness by pressing on her back muscles Normal BLE strength, sensation and DTR, negative SLR  No bony spine TTP  Assessment and Plan: Right-sided low back pain without sciatica - Plan: meloxicam (MOBIC) 7.5 MG tablet, cyclobenzaprine (FLEXERIL) 10 MG tablet  Treat as above with mobic and flexeril Follow-up if not better in the next few days Avoid  other NSAIDs while on the mobic  Signed Abbe Amsterdam, MD

## 2015-06-23 NOTE — Patient Instructions (Signed)
It appears that you have a back strain We are going to treat you with mobic (for pain and inflammation) and flexeril (for spasm) Take the mobic once a day, and the flexeril as needed - however the flexeril will make you sleepy so do not use it when you are driving Let me know if you do not feel better in the next few days- please give me a call or come back in Let me know sooner if you get worse

## 2015-11-21 ENCOUNTER — Ambulatory Visit (INDEPENDENT_AMBULATORY_CARE_PROVIDER_SITE_OTHER): Payer: BC Managed Care – PPO | Admitting: Family Medicine

## 2015-11-21 VITALS — BP 160/105 | HR 85 | Temp 97.7°F | Resp 16 | Ht 68.0 in | Wt 167.0 lb

## 2015-11-21 DIAGNOSIS — I10 Essential (primary) hypertension: Secondary | ICD-10-CM

## 2015-11-21 DIAGNOSIS — J0120 Acute ethmoidal sinusitis, unspecified: Secondary | ICD-10-CM | POA: Diagnosis not present

## 2015-11-21 DIAGNOSIS — J069 Acute upper respiratory infection, unspecified: Secondary | ICD-10-CM | POA: Diagnosis not present

## 2015-11-21 LAB — POCT CBC
GRANULOCYTE PERCENT: 38.5 % (ref 37–80)
HEMATOCRIT: 37.8 % (ref 37.7–47.9)
Hemoglobin: 12.8 g/dL (ref 12.2–16.2)
Lymph, poc: 2.4 (ref 0.6–3.4)
MCH, POC: 30.9 pg (ref 27–31.2)
MCHC: 33.9 g/dL (ref 31.8–35.4)
MCV: 90.9 fL (ref 80–97)
MID (cbc): 0.4 (ref 0–0.9)
MPV: 6.6 fL (ref 0–99.8)
PLATELET COUNT, POC: 234 10*3/uL (ref 142–424)
POC GRANULOCYTE: 1.7 — AB (ref 2–6.9)
POC LYMPH PERCENT: 52.6 %L — AB (ref 10–50)
POC MID %: 8.9 %M (ref 0–12)
RBC: 4.15 M/uL (ref 4.04–5.48)
RDW, POC: 13.3 %
WBC: 4.5 10*3/uL — AB (ref 4.6–10.2)

## 2015-11-21 MED ORDER — METHYLPREDNISOLONE ACETATE 80 MG/ML IJ SUSP
80.0000 mg | Freq: Once | INTRAMUSCULAR | Status: AC
  Administered 2015-11-21: 80 mg via INTRAMUSCULAR

## 2015-11-21 MED ORDER — HYDROCOD POLST-CPM POLST ER 10-8 MG/5ML PO SUER: 5.0000 mL | Freq: Two times a day (BID) | ORAL | Status: DC | PRN

## 2015-11-21 MED ORDER — AMLODIPINE BESYLATE 2.5 MG PO TABS
2.5000 mg | ORAL_TABLET | Freq: Every day | ORAL | Status: DC
Start: 2015-11-21 — End: 2016-06-10

## 2015-11-21 MED ORDER — GUAIFENESIN ER 1200 MG PO TB12: 1.0000 | ORAL_TABLET | Freq: Two times a day (BID) | ORAL | Status: DC | PRN

## 2015-11-21 NOTE — Patient Instructions (Signed)
Buy a home blood pressure cuff - you can find a good one online for even $30-40 - [employer] is a decent brand.  Managing Your High Blood Pressure Blood pressure is a measurement of how forceful your blood is pressing against the walls of the arteries. Arteries are muscular tubes within the circulatory system. Blood pressure does not stay the same. Blood pressure rises when you are active, excited, or nervous; and it lowers during sleep and relaxation. If the numbers measuring your blood pressure stay above normal most of the time, you are at risk for health problems. High blood pressure (hypertension) is a long-term (chronic) condition in which blood pressure is elevated. A blood pressure reading is recorded as two numbers, such as 120 over 80 (or 120/80). The first, higher number is called the systolic pressure. It is a measure of the pressure in your arteries as the heart beats. The second, lower number is called the diastolic pressure. It is a measure of the pressure in your arteries as the heart relaxes between beats.  Keeping your blood pressure in a normal range is important to your overall health and prevention of health problems, such as heart disease and stroke. When your blood pressure is uncontrolled, your heart has to work harder than normal. High blood pressure is a very common condition in adults because blood pressure tends to rise with age. Men and women are equally likely to have hypertension but at different times in life. Before age 56, men are more likely to have hypertension. After 56 years of age, women are more likely to have it. Hypertension is especially common in African Americans. This condition often has no signs or symptoms. The cause of the condition is usually not known. Your caregiver can help you come up with a plan to keep your blood pressure in a normal, healthy range. BLOOD PRESSURE STAGES Blood pressure is classified into four stages: normal, prehypertension, stage 1, and  stage 2. Your blood pressure reading will be used to determine what type of treatment, if any, is necessary. Appropriate treatment options are tied to these four stages:  Normal  Systolic pressure (mm Hg): below 120.  Diastolic pressure (mm Hg): below 80. Prehypertension  Systolic pressure (mm Hg): 120 to 139.  Diastolic pressure (mm Hg): 80 to 89. Stage1  Systolic pressure (mm Hg): 140 to 159.  Diastolic pressure (mm Hg): 90 to 99. Stage2  Systolic pressure (mm Hg): 160 or above.  Diastolic pressure (mm Hg): 100 or above. RISKS RELATED TO HIGH BLOOD PRESSURE Managing your blood pressure is an important responsibility. Uncontrolled high blood pressure can lead to:  A heart attack.  A stroke.  A weakened blood vessel (aneurysm).  Heart failure.  Kidney damage.  Eye damage.  Metabolic syndrome.  Memory and concentration problems. HOW TO MANAGE YOUR BLOOD PRESSURE Blood pressure can be managed effectively with lifestyle changes and medicines (if needed). Your caregiver will help you come up with a plan to bring your blood pressure within a normal range. Your plan should include the following: Education  Read all information provided by your caregivers about how to control blood pressure.  Educate yourself on the latest guidelines and treatment recommendations. New research is always being done to further define the risks and treatments for high blood pressure. Lifestylechanges  Control your weight.  Avoid smoking.  Stay physically active.  Reduce the amount of salt in your diet.  Reduce stress.  Control any chronic conditions, such as high cholesterol or  diabetes.  Reduce your alcohol intake. Medicines  Several medicines (antihypertensive medicines) are available, if needed, to bring blood pressure within a normal range. Communication  Review all the medicines you take with your caregiver because there may be side effects or interactions.  Talk with  your caregiver about your diet, exercise habits, and other lifestyle factors that may be contributing to high blood pressure.  See your caregiver regularly. Your caregiver can help you create and adjust your plan for managing high blood pressure. RECOMMENDATIONS FOR TREATMENT AND FOLLOW-UP  The following recommendations are based on current guidelines for managing high blood pressure in nonpregnant adults. Use these recommendations to identify the proper follow-up period or treatment option based on your blood pressure reading. You can discuss these options with your caregiver.  Systolic pressure of 120 to 139 or diastolic pressure of 80 to 89: Follow up with your caregiver as directed.  Systolic pressure of 140 to 160 or diastolic pressure of 90 to 100: Follow up with your caregiver within 2 months.  Systolic pressure above 160 or diastolic pressure above 100: Follow up with your caregiver within 1 month.  Systolic pressure above 180 or diastolic pressure above 110: Consider antihypertensive therapy; follow up with your caregiver within 1 week.  Systolic pressure above 200 or diastolic pressure above 120: Begin antihypertensive therapy; follow up with your caregiver within 1 week.   This information is not intended to replace advice given to you by your health care provider. Make sure you discuss any questions you have with your health care provider.   Document Released: 08/02/2012 Document Reviewed: 08/02/2012 Elsevier Interactive Patient Education [employer]

## 2015-11-21 NOTE — Progress Notes (Signed)
Subjective:    Patient ID: Lynn Conley, female    DOB: 02-28-1961, 54 y.o.   MRN: [id number] By signing my name below, I, Lynn Conley, attest that this documentation has been prepared under the direction and in the presence of Norberto SorensonEva Omarii Scalzo, MD.  Electronically Signed: Littie Deedsichard Conley, Medical Scribe. 11/21/2015. 7:13 PM.  Chief Complaint  Patient presents with  . Nasal Congestion    x 2 weeks  . Sore Throat  . Cough    HPI HPI Comments: Lynn Conley is a 54 y.o. female with a history of hypertension who presents to the [facility] complaining of gradual onset sore throat that started 2 weeks ago. Patient reports having associated rhinorrhea, sneezing, watery eyes, sinus pressure, nasal congestion, and dry cough. She has been taking Coricidin only for her symptoms. She has used a nasal spray in the past, but dislikes using it.  Hypertension: She did take her blood pressure medications today. She does not measure her blood pressure at home as she does not have a blood pressure cuff. Patient last had her blood pressure evaluated at her physical 7 months ago.  PCP: Dr. Caleb PoppNettey  Past Medical History  Diagnosis Date  . Hypertension    Current Outpatient Prescriptions on File Prior to Visit  Medication Sig Dispense Refill  . lisinopril-hydrochlorothiazide (PRINZIDE,ZESTORETIC) 20-12.5 MG per tablet Take 2 tablets by mouth daily. 180 tablet 3   No current facility-administered medications on file prior to visit.   No Known Allergies  Review of Systems  Constitutional: Positive for chills, diaphoresis, activity change, appetite change and fatigue. Negative for fever.  HENT: Positive for congestion, rhinorrhea, sinus pressure, sneezing and sore throat. Negative for trouble swallowing and voice change.   Eyes: Positive for discharge.  Respiratory: Positive for cough. Negative for chest tightness, shortness of breath and wheezing.   Cardiovascular: Negative for chest  pain, palpitations and leg swelling.  Genitourinary: Negative for dysuria and hematuria.  Neurological: Positive for headaches. Negative for dizziness and light-headedness.  Psychiatric/Behavioral: Positive for sleep disturbance.       Objective:  BP 160/98 mmHg  Pulse 85  Temp(Src) 97.7 F (36.5 C)  Resp 16  Ht 5\' 8"  (1.727 m)  Wt 167 lb (75.751 kg)  BMI 25.40 kg/m2  SpO2 97%  LMP 08/23/2011  Physical Exam  Constitutional: She is oriented to person, place, and time. She appears well-developed and well-nourished. No distress.  HENT:  Head: Normocephalic and atraumatic.  Left Ear: Tympanic membrane normal.  Mouth/Throat: Oropharynx is clear and moist. No oropharyngeal exudate, posterior oropharyngeal edema or posterior oropharyngeal erythema.  Right canal with cerumen. Nares friable.   Eyes: Pupils are equal, round, and reactive to light.  Neck: Neck supple.  Cardiovascular: Normal rate, regular rhythm, S1 normal, S2 normal and normal heart sounds.   No murmur heard. Repeat blood pressure: 160/105.  Pulmonary/Chest: Effort normal and breath sounds normal. No respiratory distress.  Clear to auscultation bilaterally.   Musculoskeletal: She exhibits no edema.  Lymphadenopathy:       Head (right side): Tonsillar adenopathy present.       Head (left side): Tonsillar adenopathy present.    She has cervical adenopathy.  Anterior cervical adenopathy.  Neurological: She is alert and oriented to person, place, and time. No cranial nerve deficit.  Skin: Skin is warm and dry. No rash noted.  Psychiatric: She has a normal mood and affect. Her behavior is normal.  Nursing note and vitals reviewed.  Results for orders placed or performed in visit on 11/21/15  Basic metabolic panel  Result Value Ref Range   Sodium 142 135 - 146 mmol/L   Potassium 4.4 3.5 - 5.3 mmol/L   Chloride 106 98 - 110 mmol/L   CO2 30 20 - 31 mmol/L   Glucose, Bld 97 65 - 99 mg/dL   BUN 17 7 - 25 mg/dL    Creat 1.61 0.96 - 0.45 mg/dL   Calcium 9.2 8.6 - 40.9 mg/dL  POCT CBC  Result Value Ref Range   WBC 4.5 (A) 4.6 - 10.2 K/uL   Lymph, poc 2.4 0.6 - 3.4   POC LYMPH PERCENT 52.6 (A) 10 - 50 %L   MID (cbc) 0.4 0 - 0.9   POC MID % 8.9 0 - 12 %M   POC Granulocyte 1.7 (A) 2 - 6.9   Granulocyte percent 38.5 37 - 80 %G   RBC 4.15 4.04 - 5.48 M/uL   Hemoglobin 12.8 12.2 - 16.2 g/dL   HCT, POC 81.1 91.4 - 47.9 %   MCV 90.9 80 - 97 fL   MCH, POC 30.9 27 - 31.2 pg   MCHC 33.9 31.8 - 35.4 g/dL   RDW, POC 78.2 %   Platelet Count, POC 234 142 - 424 K/uL   MPV 6.6 0 - 99.8 fL    Assessment & Plan:   1. Acute ethmoidal sinusitis, recurrence not specified - viral, try depomedrol and symptomatic care.  2. HYPERTENSION, BENIGN SYSTEMIC - start checking BP outside of the office. BP has been elevated at last sev visits so start amlodipine 2.5 in addition to lisinopril-hctz 40-25. F/u w/ PCP w/in the mo. No decongestants  3. Viral upper respiratory infection     Orders Placed This Encounter  Procedures  . Basic metabolic panel    Order Specific Question:  Has the patient fasted?    Answer:  No  . POCT CBC    Meds ordered this encounter  Medications  . amLODipine (NORVASC) 2.5 MG tablet    Sig: Take 1 tablet (2.5 mg total) by mouth daily.    Dispense:  30 tablet    Refill:  0  . Guaifenesin (MUCINEX MAXIMUM STRENGTH) 1200 MG TB12    Sig: Take 1 tablet (1,200 mg total) by mouth every 12 (twelve) hours as needed.    Dispense:  14 tablet    Refill:  1  . methylPREDNISolone acetate (DEPO-MEDROL) injection 80 mg    Sig:   . chlorpheniramine-HYDROcodone (TUSSIONEX PENNKINETIC ER) 10-8 MG/5ML SUER    Sig: Take 5 mLs by mouth every 12 (twelve) hours as needed.    Dispense:  90 mL    Refill:  0    I personally performed the services described in this documentation, which was scribed in my presence. The recorded information has been reviewed and considered, and addended by me as needed.  Norberto Sorenson, MD MPH

## 2015-11-22 LAB — BASIC METABOLIC PANEL
BUN: 17 mg/dL (ref 7–25)
CO2: 30 mmol/L (ref 20–31)
Calcium: 9.2 mg/dL (ref 8.6–10.4)
Chloride: 106 mmol/L (ref 98–110)
Creat: 0.72 mg/dL (ref 0.50–1.05)
GLUCOSE: 97 mg/dL (ref 65–99)
POTASSIUM: 4.4 mmol/L (ref 3.5–5.3)
Sodium: 142 mmol/L (ref 135–146)

## 2015-11-24 ENCOUNTER — Encounter: Payer: Self-pay | Admitting: Family Medicine

## 2016-05-11 ENCOUNTER — Other Ambulatory Visit: Payer: Self-pay | Admitting: Family Medicine

## 2016-05-11 NOTE — Telephone Encounter (Signed)
Will approve one refill. Patient requires a follow-up visit for future refills. 

## 2016-05-13 NOTE — Telephone Encounter (Signed)
Patient scheduled for 06-10-16. Lynn Conley,CMA

## 2016-06-07 ENCOUNTER — Other Ambulatory Visit: Payer: Self-pay | Admitting: *Deleted

## 2016-06-07 MED ORDER — LISINOPRIL-HYDROCHLOROTHIAZIDE 20-12.5 MG PO TABS: 2.0000 | ORAL_TABLET | Freq: Every day | ORAL | Status: DC

## 2016-06-10 ENCOUNTER — Encounter: Payer: Self-pay | Admitting: Internal Medicine

## 2016-06-10 ENCOUNTER — Ambulatory Visit (INDEPENDENT_AMBULATORY_CARE_PROVIDER_SITE_OTHER): Payer: BC Managed Care – PPO | Admitting: Internal Medicine

## 2016-06-10 VITALS — BP 135/96 | HR 80 | Temp 98.7°F | Ht 68.0 in | Wt 163.2 lb

## 2016-06-10 DIAGNOSIS — I10 Essential (primary) hypertension: Secondary | ICD-10-CM

## 2016-06-10 MED ORDER — AMLODIPINE BESYLATE 2.5 MG PO TABS: 2.5000 mg | ORAL_TABLET | Freq: Every day | ORAL | Status: DC

## 2016-06-10 NOTE — Progress Notes (Signed)
   Subjective:    Lynn Conley - 55 y.o. female MRN [id number]  Date of birth: 1961-09-29  HPI  Lynn CrazeWanda K Kates is here for f/u HTN.  Chronic HTN Disease Monitoring:  Home BP Monitoring - No. Sometimes goes to pharmacy but very rarely. Doesn't remember what kind of numbers she gets.  Chest pain- no  Dyspnea- no Headache - no  Medications: Lisinopril-HCTZ 40 mg-25 mg  Compliance- yes Lightheadedness- no  Edema- no   Preventitive Healthcare:  Exercise: yes, Zumba 2x/week    Diet Pattern: Loves to snack --- fruits, chips (but doesn't buy as much as she used to), cookies.   Salt Restriction: Yes     -  reports that she has never smoked. She has never used smokeless tobacco. - Review of Systems: Per HPI. - Past Medical History: Patient Active Problem List   Diagnosis Date Noted  . Systolic ejection murmur 01/30/2015  . Bilateral hand pain 10/03/2013  . Allergic rhinitis 06/01/2013  . MENORRHAGIA 02/03/2009  . ANEMIA, IRON DEFICIENCY, UNSPEC. 01/19/2007  . HYPERTENSION, BENIGN SYSTEMIC 01/19/2007   - Medications: reviewed and updated    Objective:   Physical Exam BP 135/96 mmHg  Pulse 80  Temp(Src) 98.7 F (37.1 C) (Oral)  Ht 5\' 8"  (1.727 m)  Wt 163 lb 3.2 oz (74.027 kg)  BMI 24.82 kg/m2  LMP 08/23/2011 Gen: NAD, alert, cooperative with exam, well-appearing CV: RRR, good S1/S2, no murmur, no edema, capillary refill brisk  Resp: CTABL, no wheezes, non-labored    Assessment & Plan:   HYPERTENSION, BENIGN SYSTEMIC Uncontrolled systolic on first check and uncontrolled diastolic on second check. Per chart review, BPs have been previously much higher. Discussed option for ambulatory BP monitoring vs. Additional pharmacotherapy. Patient elected for additional medication.  No red flags. -DASH diet reviewed, encouraged patient to exercise more than 2 times per week  -continue Lisinopril-HCTZ 40-25 mg  -Start amlodipine 2.5 mg  -f/u in 2-4 weeks for  BP check    Patient to schedule f/u for annual exam and overdue health maintenance.  Marcy Sirenatherine Alannis Hsia, D.O. 06/10/2016, 3:26 PM PGY-2, [facility]

## 2016-06-10 NOTE — Patient Instructions (Addendum)
Please make an appointment for an annual exam. You are due for a PAP smear.   I have sent the Amlodipine to your pharmacy.      DASH Eating Plan DASH stands for "Dietary Approaches to Stop Hypertension." The DASH eating plan is a healthy eating plan that has been shown to reduce high blood pressure (hypertension). Additional health benefits may include reducing the risk of type 2 diabetes mellitus, heart disease, and stroke. The DASH eating plan may also help with weight loss. WHAT DO I NEED TO KNOW ABOUT THE DASH EATING PLAN? For the DASH eating plan, you will follow these general guidelines:  Choose foods with a percent daily value for sodium of less than 5% (as listed on the food label).  Use salt-free seasonings or herbs instead of table salt or sea salt.  Check with your health care provider or pharmacist before using salt substitutes.  Eat lower-sodium products, often labeled as "lower sodium" or "no salt added."  Eat fresh foods.  Eat more vegetables, fruits, and low-fat dairy products.  Choose whole grains. Look for the word "whole" as the first word in the ingredient list.  Choose fish and skinless chicken or [place]key more often than red meat. Limit fish, poultry, and meat to 6 oz (170 g) each day.  Limit sweets, desserts, sugars, and sugary drinks.  Choose heart-healthy fats.  Limit cheese to 1 oz (28 g) per day.  Eat more home-cooked food and less restaurant, buffet, and fast food.  Limit fried foods.  Cook foods using methods other than frying.  Limit canned vegetables. If you do use them, rinse them well to decrease the sodium.  When eating at a restaurant, ask that your food be prepared with less salt, or no salt if possible. WHAT FOODS CAN I EAT? Seek help from a dietitian for individual calorie needs. Grains Whole grain or whole wheat bread. Brown rice. Whole grain or whole wheat pasta. Quinoa, bulgur, and whole grain cereals. Low-sodium cereals. Corn or  whole wheat flour tortillas. Whole grain cornbread. Whole grain crackers. Low-sodium crackers. Vegetables Fresh or frozen vegetables (raw, steamed, roasted, or grilled). Low-sodium or reduced-sodium tomato and vegetable juices. Low-sodium or reduced-sodium tomato sauce and paste. Low-sodium or reduced-sodium canned vegetables.  Fruits All fresh, canned (in natural juice), or frozen fruits. Meat and Other Protein Products Ground beef (85% or leaner), grass-fed beef, or beef trimmed of fat. Skinless chicken or [place]key. Ground chicken or [place]key. Pork trimmed of fat. All fish and seafood. Eggs. Dried beans, peas, or lentils. Unsalted nuts and seeds. Unsalted canned beans. Dairy Low-fat dairy products, such as skim or 1% milk, 2% or reduced-fat cheeses, low-fat ricotta or cottage cheese, or plain low-fat yogurt. Low-sodium or reduced-sodium cheeses. Fats and Oils Tub margarines without trans fats. Light or reduced-fat mayonnaise and salad dressings (reduced sodium). Avocado. Safflower, olive, or canola oils. Natural peanut or almond butter. Other Unsalted popcorn and pretzels. The items listed above may not be a complete list of recommended foods or beverages. Contact your dietitian for more options. WHAT FOODS ARE NOT RECOMMENDED? Grains White bread. White pasta. White rice. Refined cornbread. Bagels and croissants. Crackers that contain trans fat. Vegetables Creamed or fried vegetables. Vegetables in a cheese sauce. Regular canned vegetables. Regular canned tomato sauce and paste. Regular tomato and vegetable juices. Fruits Dried fruits. Canned fruit in light or heavy syrup. Fruit juice. Meat and Other Protein Products Fatty cuts of meat. Ribs, chicken wings, bacon, sausage, bologna, salami, chitterlings, fatback,  hot dogs, bratwurst, and packaged luncheon meats. Salted nuts and seeds. Canned beans with salt. Dairy Whole or 2% milk, cream, half-and-half, and cream cheese. Whole-fat or sweetened  yogurt. Full-fat cheeses or blue cheese. Nondairy creamers and whipped toppings. Processed cheese, cheese spreads, or cheese curds. Condiments Onion and garlic salt, seasoned salt, table salt, and sea salt. Canned and packaged gravies. Worcestershire sauce. Tartar sauce. Barbecue sauce. Teriyaki sauce. Soy sauce, including reduced sodium. Steak sauce. Fish sauce. Oyster sauce. Cocktail sauce. Horseradish. Ketchup and mustard. Meat flavorings and tenderizers. Bouillon cubes. Hot sauce. Tabasco sauce. Marinades. Taco seasonings. Relishes. Fats and Oils Butter, stick margarine, lard, shortening, ghee, and bacon fat. Coconut, palm kernel, or palm oils. Regular salad dressings. Other Pickles and olives. Salted popcorn and pretzels. The items listed above may not be a complete list of foods and beverages to avoid. Contact your dietitian for more information. WHERE CAN I FIND MORE INFORMATION? National Heart, Lung, and Blood Institute: [employer]   This information is not intended to replace advice given to you by your health care provider. Make sure you discuss any questions you have with your health care provider.   Document Released: 10/28/2011 Document Revised: 11/29/2014 Document Reviewed: 09/12/2013 Elsevier Interactive Patient Education [employer]

## 2016-06-10 NOTE — Assessment & Plan Note (Addendum)
Uncontrolled systolic on first check and uncontrolled diastolic on second check. Per chart review, BPs have been previously much higher. Discussed option for ambulatory BP monitoring vs. Additional pharmacotherapy. Patient elected for additional medication.  No red flags. -DASH diet reviewed, encouraged patient to exercise more than 2 times per week  -continue Lisinopril-HCTZ 40-25 mg  -Start amlodipine 2.5 mg  -f/u in 2-4 weeks for BP check

## 2016-06-15 ENCOUNTER — Other Ambulatory Visit: Payer: Self-pay | Admitting: Internal Medicine

## 2016-06-15 DIAGNOSIS — Z1231 Encounter for screening mammogram for malignant neoplasm of breast: Secondary | ICD-10-CM

## 2016-06-28 ENCOUNTER — Ambulatory Visit: Payer: BC Managed Care – PPO

## 2016-06-30 ENCOUNTER — Ambulatory Visit
Admission: RE | Admit: 2016-06-30 | Discharge: 2016-06-30 | Disposition: A | Discharge status: Home / Self Care | Payer: BC Managed Care – PPO | Source: Ambulatory Visit | Attending: Internal Medicine | Admitting: Internal Medicine

## 2016-06-30 DIAGNOSIS — Z1231 Encounter for screening mammogram for malignant neoplasm of breast: Secondary | ICD-10-CM

## 2016-07-06 ENCOUNTER — Other Ambulatory Visit (HOSPITAL_COMMUNITY)
Admission: RE | Admit: 2016-07-06 | Discharge: 2016-07-06 | Disposition: A | Discharge status: Home / Self Care | Payer: BC Managed Care – PPO | Source: Ambulatory Visit | Attending: Family Medicine | Admitting: Family Medicine

## 2016-07-06 ENCOUNTER — Ambulatory Visit (INDEPENDENT_AMBULATORY_CARE_PROVIDER_SITE_OTHER): Payer: BC Managed Care – PPO | Admitting: Internal Medicine

## 2016-07-06 ENCOUNTER — Encounter: Payer: Self-pay | Admitting: Internal Medicine

## 2016-07-06 VITALS — BP 146/88 | HR 74 | Temp 98.2°F | Ht 68.0 in | Wt 165.0 lb

## 2016-07-06 DIAGNOSIS — Z23 Encounter for immunization: Secondary | ICD-10-CM | POA: Diagnosis not present

## 2016-07-06 DIAGNOSIS — Z1151 Encounter for screening for human papillomavirus (HPV): Secondary | ICD-10-CM | POA: Insufficient documentation

## 2016-07-06 DIAGNOSIS — Z124 Encounter for screening for malignant neoplasm of cervix: Secondary | ICD-10-CM | POA: Insufficient documentation

## 2016-07-06 DIAGNOSIS — Z1159 Encounter for screening for other viral diseases: Secondary | ICD-10-CM

## 2016-07-06 DIAGNOSIS — Z01419 Encounter for gynecological examination (general) (routine) without abnormal findings: Secondary | ICD-10-CM | POA: Diagnosis present

## 2016-07-06 DIAGNOSIS — N898 Other specified noninflammatory disorders of vagina: Secondary | ICD-10-CM | POA: Insufficient documentation

## 2016-07-06 DIAGNOSIS — Z114 Encounter for screening for human immunodeficiency virus [HIV]: Secondary | ICD-10-CM

## 2016-07-06 LAB — POCT WET PREP (WET MOUNT)
Clue Cells Wet Prep Whiff POC: NEGATIVE
Trichomonas Wet Prep HPF POC: ABSENT

## 2016-07-06 NOTE — Assessment & Plan Note (Signed)
PAP smear performed today

## 2016-07-06 NOTE — Assessment & Plan Note (Signed)
Very minimal discharge noted on exam and appeared physiologic in nature. Wet prep obtained and negative.

## 2016-07-06 NOTE — Patient Instructions (Addendum)
We will call you with your results from today. Please follow up with Dr. Earlene PlaterWallace for your blood pressure in 1 month.

## 2016-07-06 NOTE — Progress Notes (Signed)
   Subjective:    Lynn Conley - 55 y.o. female MRN [id number]  Date of birth: [birth year]   HPI:  Patient presents today for a well woman exam.   Concerns today: Vaginal discharge without pruritis or burning. Questionable associated foul odor.  Periods: Post-menopausal  Contraception: Post-menopausal  Pelvic symptoms: None  Sexual activity: Active with 1 partner  STD Screening: Declines  Pap smear status: Overdue (last done in 2014 without HPV testing). No known h/o abnormal Paps.  Smoking: Never smoker  Alcohol: 1 drink/week on average  Drugs: No  Mood: Stable, PHQ-2 score of 0   ROS: See HPI  PMFSH:  Cancers in family: No known cancers in family.    Health Maintenance Due  Topic Date Due  . Hepatitis C Screening  [id number]  . HIV Screening  07/17/1974  . TETANUS/TDAP  12/24/2015  . PAP SMEAR  02/27/2016  . INFLUENZA VACCINE  06/22/2016    -  reports that she has never smoked. She has never used smokeless tobacco.  - Past Medical History: Patient Active Problem List   Diagnosis Date Noted  . Screening for cervical cancer 07/06/2016  . Vaginal discharge 07/06/2016  . Systolic ejection murmur 01/30/2015  . Bilateral hand pain 10/03/2013  . Allergic rhinitis 06/01/2013  . MENORRHAGIA 02/03/2009  . ANEMIA, IRON DEFICIENCY, UNSPEC. 01/19/2007  . HYPERTENSION, BENIGN SYSTEMIC 01/19/2007   - Medications: reviewed and updated    Objective:   Physical Exam BP (!) 146/88   Pulse 74   Temp 98.2 F (36.8 C) (Oral)   Ht 5\' 8"  (1.727 m)   Wt 165 lb (74.8 kg)   LMP 08/23/2011   BMI 25.09 kg/m  Gen: NAD, alert, cooperative with exam, well-appearing CV: RRR, good S1/S2, no murmur, no edema Resp: CTABL, no wheezes, non-labored GU/GYN: Exam performed in the presence of a chaperone. External genitalia within normal limits.  Vaginal mucosa pink, moist, normal rugae.  Nonfriable cervix without lesions, no discharge or bleeding noted on speculum exam.  Bimanual exam  revealed normal, nongravid uterus.  No cervical motion tenderness. No adnexal masses bilaterally.       Assessment & Plan:   Screening for cervical cancer PAP smear performed today.   Vaginal discharge Very minimal discharge noted on exam and appeared physiologic in nature. Wet prep obtained and negative.   HIV and Hep C screening performed today   UTD on mammogram    Marcy Sirenatherine Kairah Leoni, D.O. 07/06/2016, 4:25 PM PGY-2, [facility]

## 2016-07-07 LAB — HEPATITIS C ANTIBODY: HCV Ab: NEGATIVE

## 2016-07-07 LAB — HIV ANTIBODY (ROUTINE TESTING W REFLEX): HIV 1&2 Ab, 4th Generation: NONREACTIVE

## 2016-07-08 LAB — CYTOLOGY - PAP

## 2016-07-12 ENCOUNTER — Telehealth: Payer: Self-pay | Admitting: *Deleted

## 2016-07-12 ENCOUNTER — Encounter: Payer: Self-pay | Admitting: *Deleted

## 2016-07-12 NOTE — Telephone Encounter (Signed)
Letter mailed to patient. Hesper Venturella,CMA  

## 2016-07-12 NOTE — Telephone Encounter (Signed)
-----   Message from Arvilla Marketatherine Lauren Wallace, DO sent at 07/09/2016  1:25 PM EDT ----- Please call patient and let her know PAP was normal and she will not need repeat for 5 years. HIV and Hepatitis C were also negative.   Marcy Sirenatherine Wallace, D.O. 07/09/2016, 1:25 PM PGY-2, [facility]

## 2016-09-22 ENCOUNTER — Encounter (HOSPITAL_COMMUNITY): Payer: Self-pay | Admitting: Emergency Medicine

## 2016-09-22 ENCOUNTER — Ambulatory Visit (INDEPENDENT_AMBULATORY_CARE_PROVIDER_SITE_OTHER): Payer: BC Managed Care – PPO | Admitting: Urgent Care

## 2016-09-22 ENCOUNTER — Ambulatory Visit (INDEPENDENT_AMBULATORY_CARE_PROVIDER_SITE_OTHER): Payer: BC Managed Care – PPO

## 2016-09-22 ENCOUNTER — Emergency Department (HOSPITAL_COMMUNITY)
Admission: EM | Admit: 2016-09-22 | Discharge: 2016-09-23 | Disposition: A | Discharge status: Home / Self Care | Payer: BC Managed Care – PPO | Attending: Emergency Medicine | Admitting: Emergency Medicine

## 2016-09-22 VITALS — BP 136/96 | HR 82 | Temp 99.0°F | Resp 20 | Ht 68.0 in | Wt 166.8 lb

## 2016-09-22 DIAGNOSIS — R1084 Generalized abdominal pain: Secondary | ICD-10-CM | POA: Diagnosis not present

## 2016-09-22 DIAGNOSIS — R05 Cough: Secondary | ICD-10-CM

## 2016-09-22 DIAGNOSIS — I10 Essential (primary) hypertension: Secondary | ICD-10-CM | POA: Insufficient documentation

## 2016-09-22 DIAGNOSIS — R9389 Abnormal findings on diagnostic imaging of other specified body structures: Secondary | ICD-10-CM

## 2016-09-22 DIAGNOSIS — R1010 Upper abdominal pain, unspecified: Secondary | ICD-10-CM | POA: Diagnosis not present

## 2016-09-22 DIAGNOSIS — R938 Abnormal findings on diagnostic imaging of other specified body structures: Secondary | ICD-10-CM

## 2016-09-22 DIAGNOSIS — R059 Cough, unspecified: Secondary | ICD-10-CM

## 2016-09-22 DIAGNOSIS — R1011 Right upper quadrant pain: Secondary | ICD-10-CM | POA: Diagnosis present

## 2016-09-22 DIAGNOSIS — Z79899 Other long term (current) drug therapy: Secondary | ICD-10-CM | POA: Diagnosis not present

## 2016-09-22 LAB — POCT CBC
GRANULOCYTE PERCENT: 78.9 % (ref 37–80)
HCT, POC: 39.7 % (ref 37.7–47.9)
Hemoglobin: 13.8 g/dL (ref 12.2–16.2)
Lymph, poc: 1.5 (ref 0.6–3.4)
MCH: 31.9 pg — AB (ref 27–31.2)
MCHC: 34.7 g/dL (ref 31.8–35.4)
MCV: 91.9 fL (ref 80–97)
MID (CBC): 0.1 (ref 0–0.9)
MPV: 7.2 fL (ref 0–99.8)
POC Granulocyte: 5.8 (ref 2–6.9)
POC LYMPH PERCENT: 20.2 %L (ref 10–50)
POC MID %: 0.9 % (ref 0–12)
Platelet Count, POC: 212 10*3/uL (ref 142–424)
RBC: 4.32 M/uL (ref 4.04–5.48)
RDW, POC: 13.4 %
WBC: 7.3 10*3/uL (ref 4.6–10.2)

## 2016-09-22 LAB — URINALYSIS, ROUTINE W REFLEX MICROSCOPIC
Bilirubin Urine: NEGATIVE
Glucose, UA: NEGATIVE mg/dL
KETONES UR: NEGATIVE mg/dL
LEUKOCYTES UA: NEGATIVE
NITRITE: NEGATIVE
PH: 6 (ref 5.0–8.0)
Protein, ur: NEGATIVE mg/dL
Specific Gravity, Urine: 1.024 (ref 1.005–1.030)

## 2016-09-22 LAB — COMPREHENSIVE METABOLIC PANEL
ALK PHOS: 54 U/L (ref 38–126)
ALT: 19 U/L (ref 14–54)
ANION GAP: 10 (ref 5–15)
AST: 27 U/L (ref 15–41)
Albumin: 4.3 g/dL (ref 3.5–5.0)
BILIRUBIN TOTAL: 0.3 mg/dL (ref 0.3–1.2)
BUN: 12 mg/dL (ref 6–20)
CALCIUM: 9.7 mg/dL (ref 8.9–10.3)
CO2: 25 mmol/L (ref 22–32)
Chloride: 102 mmol/L (ref 101–111)
Creatinine, Ser: 0.75 mg/dL (ref 0.44–1.00)
GFR calc Af Amer: >60 mL/min (ref >60)
Glucose, Bld: 119 mg/dL — ABNORMAL HIGH (ref 65–99)
POTASSIUM: 3.7 mmol/L (ref 3.5–5.1)
Sodium: 137 mmol/L (ref 135–145)
TOTAL PROTEIN: 7.3 g/dL (ref 6.5–8.1)

## 2016-09-22 LAB — URINE MICROSCOPIC-ADD ON

## 2016-09-22 LAB — CBC
HEMATOCRIT: 41.3 % (ref 36.0–46.0)
HEMOGLOBIN: 13.7 g/dL (ref 12.0–15.0)
MCH: 30.9 pg (ref 26.0–34.0)
MCHC: 33.2 g/dL (ref 30.0–36.0)
MCV: 93.2 fL (ref 78.0–100.0)
Platelets: 231 10*3/uL (ref 150–400)
RBC: 4.43 MIL/uL (ref 3.87–5.11)
RDW: 13.1 % (ref 11.5–15.5)
WBC: 6.8 10*3/uL (ref 4.0–10.5)

## 2016-09-22 LAB — LIPASE, BLOOD: Lipase: 26 U/L (ref 11–51)

## 2016-09-22 NOTE — ED Provider Notes (Signed)
[facility]-EMERGENCY DEPT Provider Note   CSN: [id number] Arrival date & time: 09/22/16  1924   By signing my name below, I, Clovis Pu, attest that this documentation has been prepared under the direction and in the presence of Azalia Bilis, MD  Electronically Signed: Clovis Pu, ED Scribe. 09/22/16. 11:56 PM.   History   Chief Complaint Chief Complaint  Patient presents with  . Abdominal Pain   The history is provided by the patient. No language interpreter was used.   HPI Comments:  Lynn Conley is a 55 y.o. female, with a hx of HTN, who presents to the Emergency Department complaining of sudden onset, intermittent, moderate right upper abdominal pain which began around 4:30 PM today. 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 She notes her pain radiated to the left upper side of her abdomen. Pt also reports right shoulder and right neck pain. Pt states she currently feels fine. She denies nausea, vomiting, diarrhea, pain with urination, frequency, dysuria, any other urinary symptoms, a hx of abdominal surgery and any major medical issues. Pt was sent to the ED from Urgent Care. No alleviating factors noted. Pt denies any other symptoms or complaints at this time.  Past Medical History:  Diagnosis Date  . Hypertension     Patient Active Problem List   Diagnosis Date Noted  . Screening for cervical cancer 07/06/2016  . Vaginal discharge 07/06/2016  . Systolic ejection murmur 01/30/2015  . Bilateral hand pain 10/03/2013  . Allergic rhinitis 06/01/2013  . MENORRHAGIA 02/03/2009  . ANEMIA, IRON DEFICIENCY, UNSPEC. 01/19/2007  . HYPERTENSION, BENIGN SYSTEMIC 01/19/2007    History reviewed. No pertinent surgical history.  OB History    No data available       Home Medications    Prior to Admission medications   Medication Sig Start Date End Date Taking? Authorizing Provider  amLODipine (NORVASC) 2.5 MG tablet Take 1 tablet (2.5 mg total) by mouth daily.  06/10/16  Yes Arvilla Market, DO  lisinopril-hydrochlorothiazide (PRINZIDE,ZESTORETIC) 20-12.5 MG tablet Take 2 tablets by mouth daily. Patient not taking: Reported on 09/22/2016 06/07/16   Arvilla Market, DO    Family History Family History  Problem Relation Age of Onset  . Hyperlipidemia Mother   . Hypertension Mother   . Heart attack Father   . Hypertension Sister   . Colon cancer Neg Hx     Social History Social History  Substance Use Topics  . Smoking status: Never Smoker  . Smokeless tobacco: Never Used  . Alcohol use 0.6 oz/week    1 Glasses of wine per week     Allergies   Review of patient's allergies indicates no known allergies.   Review of Systems Review of Systems  Gastrointestinal: Positive for abdominal pain. Negative for diarrhea, nausea and vomiting.  Genitourinary: Negative for dysuria and frequency.  Musculoskeletal: Positive for arthralgias and neck pain.  10 systems reviewed and all are negative for acute change except as noted in the HPI.  Physical Exam Updated Vital Signs BP (!) 152/114 (BP Location: Left Arm)   Pulse 73   Temp 98.3 F (36.8 C) (Oral)   Resp 19   LMP 08/23/2011   SpO2 100%   Physical Exam  Constitutional: She is oriented to person, place, and time. She appears well-developed and well-nourished. No distress.  HENT:  Head: Normocephalic and atraumatic.  Eyes: EOM are normal.  Neck: Normal range of motion.  Cardiovascular: Normal rate, regular rhythm and  normal heart sounds.   Pulmonary/Chest: Effort normal and breath sounds normal.  Abdominal: Soft. She exhibits no distension. There is no tenderness.  Musculoskeletal: Normal range of motion.  Neurological: She is alert and oriented to person, place, and time.  Skin: Skin is warm and dry.  Psychiatric: She has a normal mood and affect. Judgment normal.  Nursing note and vitals reviewed.    ED Treatments / Results  DIAGNOSTIC STUDIES:  Oxygen  Saturation is 100% on RA, normal by my interpretation.    COORDINATION OF CARE:  11:54 PM Discussed treatment plan with pt at bedside and pt agreed to plan.  Labs (all labs ordered are listed, but only abnormal results are displayed) Labs Reviewed  COMPREHENSIVE METABOLIC PANEL - Abnormal; Notable for the following:       Result Value   Glucose, Bld 119 (*)    All other components within normal limits  URINALYSIS, ROUTINE W REFLEX MICROSCOPIC (NOT AT [facility]) - Abnormal; Notable for the following:    Hgb urine dipstick MODERATE (*)    All other components within normal limits  URINE MICROSCOPIC-ADD ON - Abnormal; Notable for the following:    Squamous Epithelial / LPF 0-5 (*)    Bacteria, UA FEW (*)    Casts HYALINE CASTS (*)    All other components within normal limits  LIPASE, BLOOD  CBC    EKG  EKG Interpretation  Date/Time:  Wednesday September 22 2016 19:48:12 EDT Ventricular Rate:  91 PR Interval:  144 QRS Duration: 68 QT Interval:  326 QTC Calculation: 400 R Axis:   45 Text Interpretation:  Normal sinus rhythm with sinus arrhythmia Possible Left atrial enlargement Septal infarct , age undetermined ST & T wave abnormality, consider inferior ischemia Abnormal ECG No significant change was found Confirmed by Polo Mcmartin  MD, Montie Gelardi ([id number]) on 09/22/2016 11:50:17 PM       Radiology Dg Chest 2 View  Result Date: 09/22/2016 CLINICAL DATA:  Right upper quadrant abdominal pain with cough EXAM: CHEST  2 VIEW COMPARISON:  05/08/2013 FINDINGS: Elevation of the right diaphragm. Mild atelectasis or infiltrate left lung base. Stable cardiomediastinal silhouette. No pneumothorax. Lateral view demonstrates anterior displacement of bowel loops by possible abdominal mass or organ enlargement. IMPRESSION: 1. Small atelectasis or infiltrate at the left base 2. Oval soft tissue at opacity in the upper abdomen on the lateral view with anterior displacement of bowel loops. Mass or organ enlargement  cannot be excluded, suggest CT for further evaluation. Electronically Signed   By: Jasmine PangKim  Fujinaga M.D.   On: 09/22/2016 18:33   Ct Abdomen Pelvis W Contrast  Result Date: 09/23/2016 CLINICAL DATA:  Acute onset of upper abdominal pain. Question of mass on radiograph. Initial encounter. EXAM: CT ABDOMEN AND PELVIS WITH CONTRAST TECHNIQUE: Multidetector CT imaging of the abdomen and pelvis was performed using the standard protocol following bolus administration of intravenous contrast. CONTRAST:  100mL ISOVUE-300 IOPAMIDOL (ISOVUE-300) INJECTION 61% COMPARISON:  Pelvic ultrasound performed 08/25/2006, and chest radiograph performed 09/22/2016 FINDINGS: Lower chest: Minimal bibasilar atelectasis or scarring is noted. The visualized portions of the mediastinum are unremarkable. Hepatobiliary: The liver is unremarkable in appearance. The gallbladder is unremarkable in appearance. The common bile duct remains normal in caliber. Pancreas: The pancreas is within normal limits. Spleen: The spleen is unremarkable in appearance. Adrenals/Urinary Tract: The adrenal glands are unremarkable in appearance. A 2.5 cm left renal cyst is noted. There is no evidence of hydronephrosis. No renal or ureteral stones are identified. No perinephric  stranding is seen. Stomach/Bowel: The stomach is unremarkable in appearance. The small bowel is within normal limits. The appendix contains air and appears to be normal in caliber, without evidence of appendicitis. The colon is unremarkable in appearance. Contrast progresses to the distal ascending colon. Vascular/Lymphatic: The abdominal aorta is unremarkable in appearance. The inferior vena cava is grossly unremarkable. No retroperitoneal lymphadenopathy is seen. No pelvic sidewall lymphadenopathy is identified. Reproductive: The bladder is mildly distended and grossly unremarkable. Multiple uterine fibroids are seen, with scattered calcification. The ovaries are grossly symmetric. No  suspicious adnexal masses are seen. Other: No additional soft tissue abnormalities are seen. Musculoskeletal: No acute osseous abnormalities are identified. The visualized musculature is unremarkable in appearance. IMPRESSION: 1. Rounded opacity on chest radiograph appears to have reflected underlying normal retroperitoneal structures. No acute abnormality seen within the abdomen or pelvis. 2. Left renal cyst noted. 3. Multiple uterine fibroids, with scattered calcification. Electronically Signed   By: Roanna RaiderJeffery  Chang M.D.   On: 09/23/2016 02:15    Procedures Procedures (including critical care time)  Medications Ordered in ED Medications  iopamidol (ISOVUE-300) 61 % injection (100 mLs Intravenous Contrast Given 09/23/16 0138)  iopamidol (ISOVUE-300) 61 % injection (15 mLs Oral Contrast Given 09/23/16 0115)     Initial Impression / Assessment and Plan / ED Course  I have reviewed the triage vital signs and the nursing notes.  Pertinent labs & imaging results that were available during my care of the patient were reviewed by me and considered in my medical decision making (see chart for details).  Clinical Course    Patient without symptoms at time my evaluation.  Workup at urgent care did demonstrate a possible intra-abdominal mass noted on chest x-ray.  Chest x-ray was otherwise unremarkable.  Given the abnormal findings on chest x-ray and her complaints of upper abdominal pain earlier in the day CT scan was performed to further evaluate.  CT scan demonstrates normal retroperitoneal structures.  Patient continues to be asymptomatic.  Symptoms are transient.  Patient can safely be discharged from the emergency department.  She understands to return to the ER for new or worsening symptoms  Final Clinical Impressions(s) / ED Diagnoses   Final diagnoses:  Generalized abdominal pain    New Prescriptions New Prescriptions   No medications on file   I personally performed the services  described in this documentation, which was scribed in my presence. The recorded information has been reviewed and is accurate.        Azalia BilisKevin Vincient Vanaman, MD 09/23/16 [phone]

## 2016-09-22 NOTE — Progress Notes (Signed)
MRN: [id number] DOB: Apr 12, 1961  Subjective:   Lynn Conley is a 55 y.o. female presenting for chief complaint of Cough (nasal congestion, yellow mucus, ) and Abdominal Pain (started at work today )  Reports 5 day history of worsening dry cough, sore throat, chest congestion. Now having upper right belly pain today, right back pain associated with her cough. Has tried Delsym, otc cold medications with some relief. Denies fever, shob, wheezing, n/v, rashes, sinus congestion, sinus pain, ear pain, ear drainage, body aches. Denies smoking cigarettes. Denies history of asthma, allergies. Denies family history of lung disease including sarcoidosis. Received her flu shot ~3 weeks ago. Reports very unhealthy diet. Denies history of liver or kidney disease.  Lynn Conley has a current medication list which includes the following prescription(s): amlodipine and lisinopril-hydrochlorothiazide. Also has No Known Allergies.  Lynn Conley  has a past medical history of Hypertension. Also  has no past surgical history on file.  Objective:   Vitals: BP (!) 136/96 (BP Location: Right Arm, Patient Position: Sitting, Cuff Size: Small)   Pulse 82   Temp 99 F (37.2 C) (Oral)   Resp 20   Ht 5\' 8"  (1.727 m)   Wt 166 lb 12.8 oz (75.7 kg)   LMP 08/23/2011   SpO2 96%   BMI 25.36 kg/m   Physical Exam  Constitutional: She is oriented to person, place, and time. She appears well-developed and well-nourished.  HENT:  Mouth/Throat: Oropharynx is clear and moist.  Eyes: No scleral icterus.  Neck: Normal range of motion. Neck supple.  Cardiovascular: Normal rate, regular rhythm and intact distal pulses.  Exam reveals no gallop and no friction rub.   No murmur heard. Pulmonary/Chest: No respiratory distress. She has no wheezes. She has no rales. She exhibits no tenderness.  Abdominal: Soft. Bowel sounds are normal. She exhibits no distension and no mass. There is tenderness (epigastric-RUQ, mild with deep palpation).  There is no guarding.  Lymphadenopathy:    She has no cervical adenopathy.  Neurological: She is alert and oriented to person, place, and time.  Skin: Skin is warm and dry.   Dg Chest 2 View  Result Date: 09/22/2016 CLINICAL DATA:  Right upper quadrant abdominal pain with cough EXAM: CHEST  2 VIEW COMPARISON:  05/08/2013 FINDINGS: Elevation of the right diaphragm. Mild atelectasis or infiltrate left lung base. Stable cardiomediastinal silhouette. No pneumothorax. Lateral view demonstrates anterior displacement of bowel loops by possible abdominal mass or organ enlargement. IMPRESSION: 1. Small atelectasis or infiltrate at the left base 2. Oval soft tissue at opacity in the upper abdomen on the lateral view with anterior displacement of bowel loops. Mass or organ enlargement cannot be excluded, suggest CT for further evaluation. Electronically Signed   By: Jasmine PangKim  Fujinaga M.D.   On: 09/22/2016 18:33   Results for orders placed or performed in visit on 09/22/16 (from the past 24 hour(s))  POCT CBC     Status: Abnormal   Collection Time: 09/22/16  6:18 PM  Result Value Ref Range   WBC 7.3 4.6 - 10.2 K/uL   Lymph, poc 1.5 0.6 - 3.4   POC LYMPH PERCENT 20.2 10 - 50 %L   MID (cbc) 0.1 0 - 0.9   POC MID % 0.9 0 - 12 %M   POC Granulocyte 5.8 2 - 6.9   Granulocyte percent 78.9 37 - 80 %G   RBC 4.32 4.04 - 5.48 M/uL   Hemoglobin 13.8 12.2 - 16.2 g/dL   HCT, POC 39.7  37.7 - 47.9 %   MCV 91.9 80 - 97 fL   MCH, POC 31.9 (A) 27 - 31.2 pg   MCHC 34.7 31.8 - 35.4 g/dL   RDW, POC 16.113.4 %   Platelet Count, POC 212 142 - 424 K/uL   MPV 7.2 0 - 99.8 fL   Assessment and Plan :   1. Cough 2. Essential hypertension 3. Upper abdominal pain 4. Abnormal chest x-ray - Unclear source for her symptoms, consider CAP, mass effect from abdominal mass, acute abdomen. Will pursue STAT CT abdomen/pelvis. Patient verbalized understanding.  Wallis BambergMario Murle Otting, PA-C [facility] [phone] 09/22/2016 5:57 PM

## 2016-09-22 NOTE — Patient Instructions (Addendum)
  [place] now. Go through ER and advise you are there for a CT scan and they are expecting you. Test may take 2-3 hours.  [address], [place], [place] [address]  [employer] no PA needed.    IF you received an x-ray today, you will receive an invoice from [facility]. Please contact [facility] at [phone] with questions or concerns regarding your invoice.   IF you received labwork today, you will receive an invoice from [facility]. Please contact [facility] at [phone] with questions or concerns regarding your invoice.   Our billing staff will not be able to assist you with questions regarding bills from these companies.  You will be contacted with the lab results as soon as they are available. The fastest way to get your results is to activate your My Chart account. Instructions are located on the last page of this paperwork. If you have not heard from us regarding the results in 2 weeks, please contact this office.

## 2016-09-22 NOTE — ED Triage Notes (Signed)
Pt sent from [facility] for abd pain.  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  Also reports R shoulder/neck pain.  Denies nausea, vomiting, diarrhea, constipation, or urinary complaints.

## 2016-09-23 ENCOUNTER — Encounter (HOSPITAL_COMMUNITY): Payer: Self-pay | Admitting: Radiology

## 2016-09-23 ENCOUNTER — Emergency Department (HOSPITAL_COMMUNITY): Payer: BC Managed Care – PPO

## 2016-09-23 ENCOUNTER — Telehealth: Payer: Self-pay | Admitting: Family Medicine

## 2016-09-23 LAB — BRAIN NATRIURETIC PEPTIDE: BRAIN NATRIURETIC PEPTIDE: 14.7 pg/mL (ref <100)

## 2016-09-23 LAB — COMPLETE METABOLIC PANEL WITH GFR
ALT: 15 U/L (ref 6–29)
AST: 23 U/L (ref 10–35)
Albumin: 4.4 g/dL (ref 3.6–5.1)
Alkaline Phosphatase: 51 U/L (ref 33–130)
BUN: 14 mg/dL (ref 7–25)
CHLORIDE: 102 mmol/L (ref 98–110)
CO2: 29 mmol/L (ref 20–31)
Calcium: 9.5 mg/dL (ref 8.6–10.4)
Creat: 0.82 mg/dL (ref 0.50–1.05)
GFR, Est African American: >89 mL/min (ref >=60)
GFR, Est Non African American: 80 mL/min (ref >=60)
GLUCOSE: 112 mg/dL — AB (ref 65–99)
POTASSIUM: 3.8 mmol/L (ref 3.5–5.3)
SODIUM: 140 mmol/L (ref 135–146)
Total Bilirubin: 0.2 mg/dL (ref 0.2–1.2)
Total Protein: 7.4 g/dL (ref 6.1–8.1)

## 2016-09-23 MED ORDER — HYDROCODONE-HOMATROPINE 5-1.5 MG/5ML PO SYRP: 5.0000 mL | ORAL_SOLUTION | Freq: Every evening | ORAL | 0 refills | Status: DC | PRN

## 2016-09-23 MED ORDER — BENZONATATE 100 MG PO CAPS: 100.0000 mg | ORAL_CAPSULE | Freq: Three times a day (TID) | ORAL | 0 refills | Status: DC | PRN

## 2016-09-23 MED ORDER — IOPAMIDOL (ISOVUE-300) INJECTION 61%
INTRAVENOUS | Status: AC
  Administered 2016-09-23: 15 mL via ORAL
  Filled 2016-09-23: qty 100

## 2016-09-23 MED ORDER — AZITHROMYCIN 250 MG PO TABS: ORAL_TABLET | ORAL | 0 refills | Status: DC

## 2016-09-23 MED ORDER — IOPAMIDOL (ISOVUE-300) INJECTION 61%
INTRAVENOUS | Status: AC
  Administered 2016-09-23: 100 mL via INTRAVENOUS
  Filled 2016-09-23: qty 30

## 2016-09-23 NOTE — ED Notes (Signed)
Patient transported to CT 

## 2016-09-23 NOTE — Telephone Encounter (Signed)
Pt calling stating that Lynn Conley was suppose to be given her some medicine for her cough and congestion she was sent to hospital but  They didn't give her anything and that she came into office for illness please respond and [facility] [address] [facility] [address]

## 2016-09-23 NOTE — Telephone Encounter (Signed)
Discussed treatment plan with patient. She had negative work up with abdominal CT yesterday. However, chest x-ray did show possible infiltrate. Will manage her cough as a lower respiratory infection. She is to start Azithromycin, cough suppression. Patient is in agreement with treatment plan and will rtc on 09/27/2016 if no improvement.

## 2016-12-07 ENCOUNTER — Other Ambulatory Visit: Payer: Self-pay | Admitting: Internal Medicine

## 2016-12-07 DIAGNOSIS — I10 Essential (primary) hypertension: Secondary | ICD-10-CM

## 2017-02-03 ENCOUNTER — Other Ambulatory Visit: Payer: Self-pay | Admitting: Internal Medicine

## 2017-03-03 ENCOUNTER — Other Ambulatory Visit: Payer: Self-pay | Admitting: Internal Medicine

## 2017-05-19 ENCOUNTER — Other Ambulatory Visit: Payer: Self-pay | Admitting: Internal Medicine

## 2017-06-10 ENCOUNTER — Other Ambulatory Visit: Payer: Self-pay | Admitting: Internal Medicine

## 2017-06-10 DIAGNOSIS — Z1231 Encounter for screening mammogram for malignant neoplasm of breast: Secondary | ICD-10-CM

## 2017-06-14 ENCOUNTER — Other Ambulatory Visit: Payer: Self-pay | Admitting: Internal Medicine

## 2017-07-01 ENCOUNTER — Ambulatory Visit
Admission: RE | Admit: 2017-07-01 | Discharge: 2017-07-01 | Disposition: A | Discharge status: Home / Self Care | Payer: BC Managed Care – PPO | Source: Ambulatory Visit | Attending: Internal Medicine | Admitting: Internal Medicine

## 2017-07-01 DIAGNOSIS — Z1231 Encounter for screening mammogram for malignant neoplasm of breast: Secondary | ICD-10-CM

## 2017-07-15 IMAGING — CT CT ABD-PELV W/ CM
2 of 5 series · 11 of 46 positions shown, 12 images · IV contrast (iopamidol)
Comparison: Pelvic ultrasound performed 08/25/2006, and chest
radiograph performed 09/22/2016

CLINICAL DATA: Acute onset of upper abdominal pain. Question of
mass on radiograph. Initial encounter.

EXAM:
CT ABDOMEN AND PELVIS WITH CONTRAST
TECHNIQUE: Multidetector CT imaging of the abdomen and pelvis was performed
using the standard protocol following bolus administration of
intravenous contrast.
CONTRAST:  100mL [id number] IOPAMIDOL (OG3ZBW-O11) INJECTION 61%

[Series 201: routine, idose (2) · axial · 0.65mm/px · z∈[-414,-54]mm · 8 of 90 slices shown, 9 images]
[im 9/90  soft-tissue]
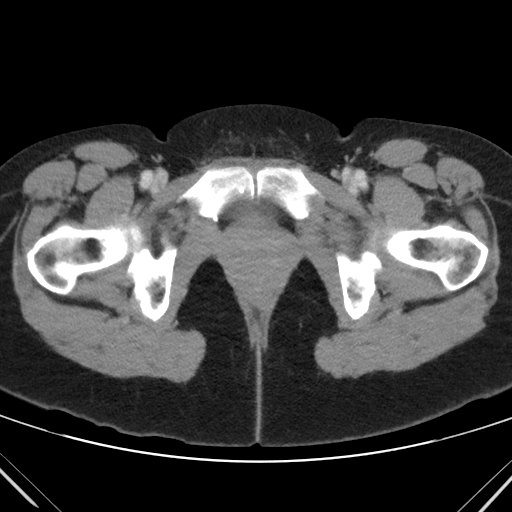
[im 9/90  bone]
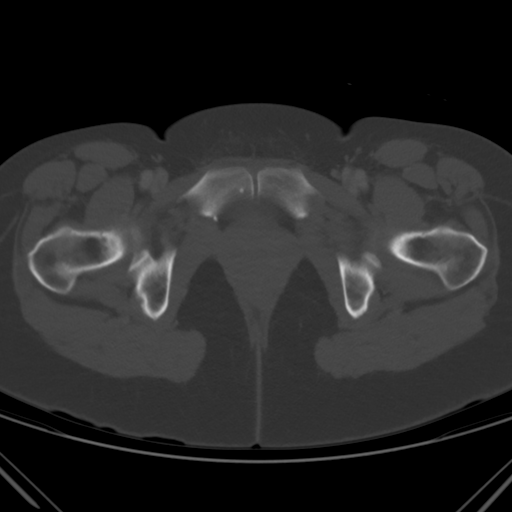
[im 18/90  soft-tissue]
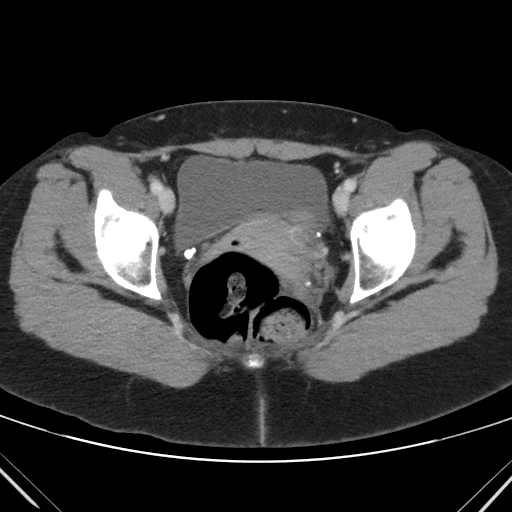
[im 27/90  soft-tissue]
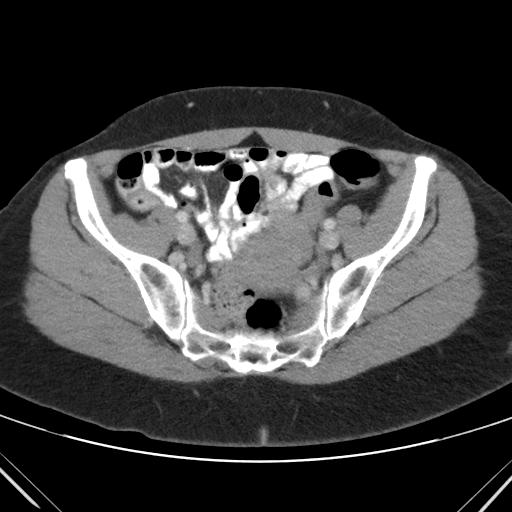
[im 41/90  soft-tissue]
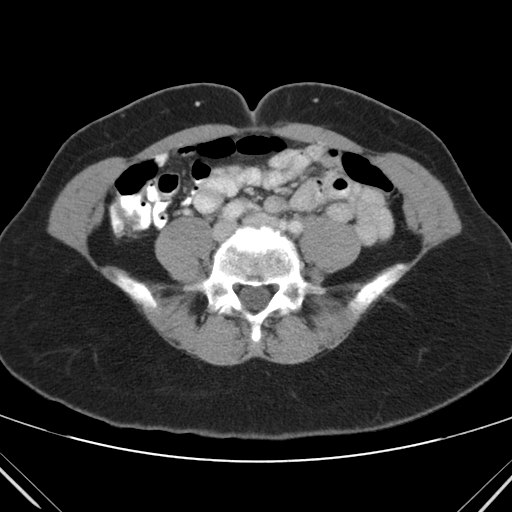
[im 49/90  soft-tissue]
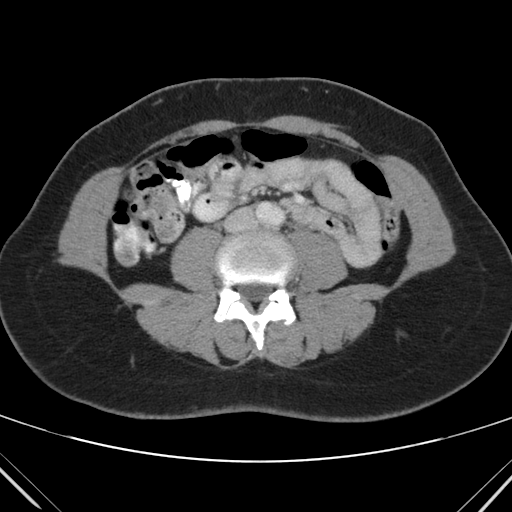
[im 63/90  soft-tissue]
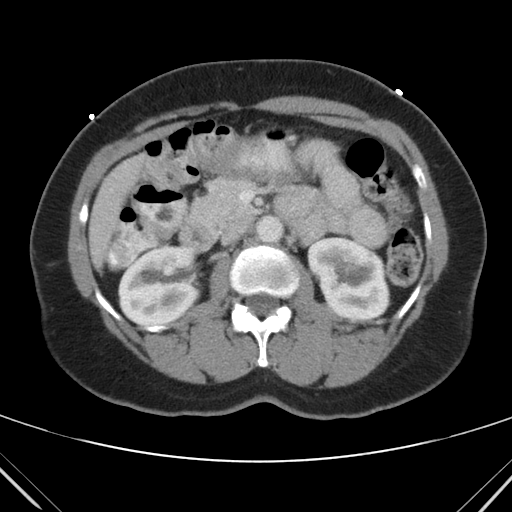
[im 72/90  soft-tissue]
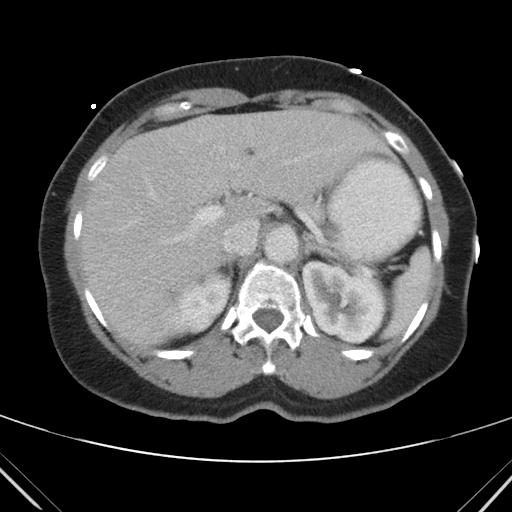
[im 81/90  soft-tissue]
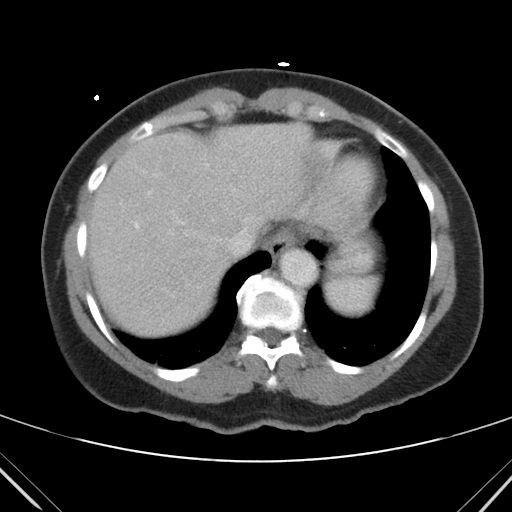

[Series 203: coronals, idose (2) · coronal · 0.45mm/px · 3 of 105 slices shown]
[im 35/105  soft-tissue]
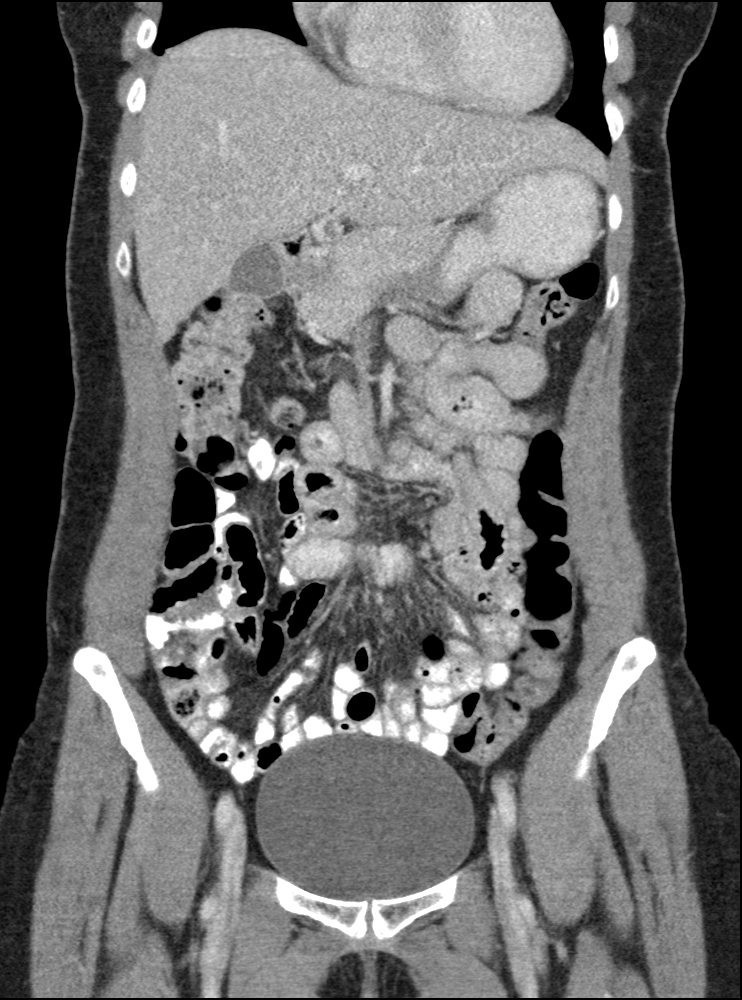
[im 47/105  soft-tissue]
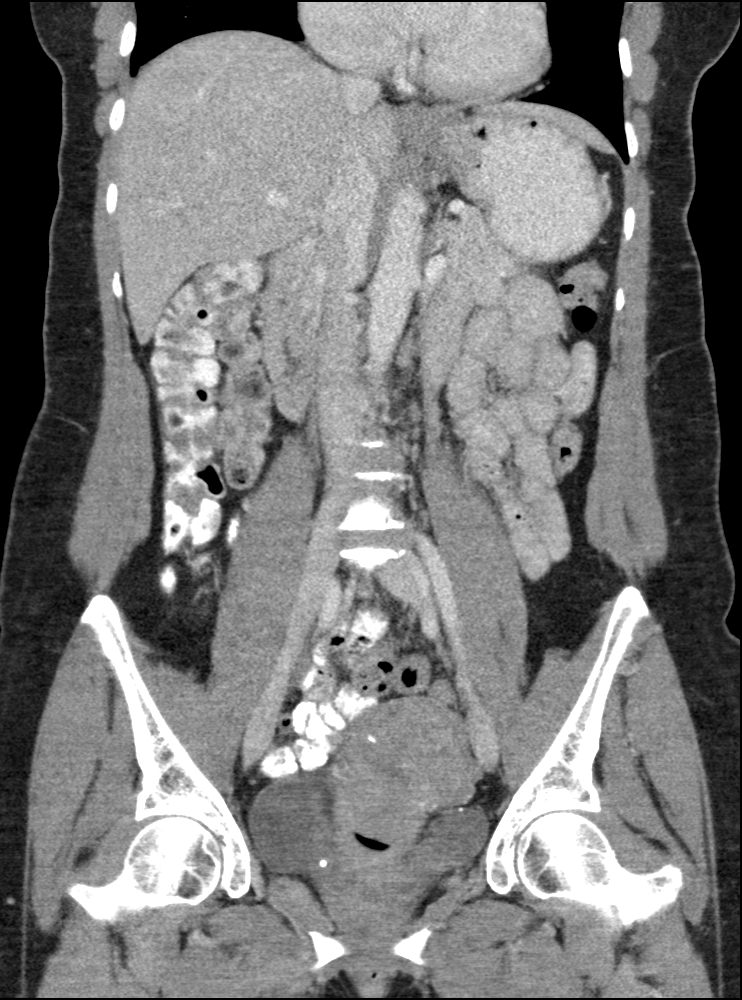
[im 58/105  soft-tissue]
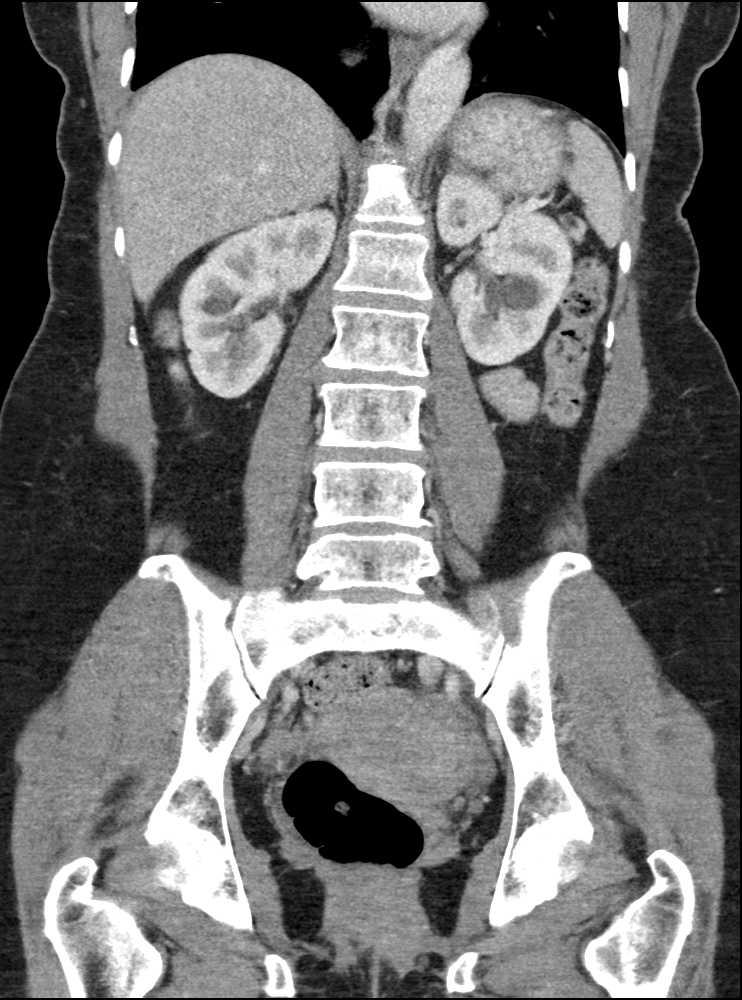

[11 of 46 positions shown; findings below may reference images not displayed]

FINDINGS: Lower chest: Minimal bibasilar atelectasis or scarring is noted. The
visualized portions of the mediastinum are unremarkable.

Hepatobiliary: The liver is unremarkable in appearance. The
gallbladder is unremarkable in appearance. The common bile duct
remains normal in caliber.

Pancreas: The pancreas is within normal limits.

Spleen: The spleen is unremarkable in appearance.

Adrenals/Urinary Tract: The adrenal glands are unremarkable in
appearance. A 2.5 cm left renal cyst is noted. There is no evidence
of hydronephrosis. No renal or ureteral stones are identified. No
perinephric stranding is seen.

Stomach/Bowel: The stomach is unremarkable in appearance. The small
bowel is within normal limits. The appendix contains air and appears
to be normal in caliber, without evidence of appendicitis. The colon
is unremarkable in appearance. Contrast progresses to the distal
ascending colon.

Vascular/Lymphatic: The abdominal aorta is unremarkable in
appearance. The inferior vena cava is grossly unremarkable. No
retroperitoneal lymphadenopathy is seen. No pelvic sidewall
lymphadenopathy is identified.

Reproductive: The bladder is mildly distended and grossly
unremarkable. Multiple uterine fibroids are seen, with scattered
calcification. The ovaries are grossly symmetric. No suspicious
adnexal masses are seen.

Other: No additional soft tissue abnormalities are seen.

Musculoskeletal: No acute osseous abnormalities are identified. The
visualized musculature is unremarkable in appearance.
IMPRESSION: 1. Rounded opacity on chest radiograph appears to have reflected
underlying normal retroperitoneal structures. No acute abnormality
seen within the abdomen or pelvis.
2. Left renal cyst noted.
3. Multiple uterine fibroids, with scattered calcification.

## 2017-11-08 ENCOUNTER — Other Ambulatory Visit: Payer: Self-pay | Admitting: Internal Medicine

## 2018-02-07 ENCOUNTER — Other Ambulatory Visit: Payer: Self-pay | Admitting: Internal Medicine

## 2018-04-03 ENCOUNTER — Other Ambulatory Visit: Payer: Self-pay | Admitting: Internal Medicine

## 2018-05-22 ENCOUNTER — Other Ambulatory Visit: Payer: Self-pay | Admitting: Internal Medicine

## 2018-05-22 DIAGNOSIS — Z1231 Encounter for screening mammogram for malignant neoplasm of breast: Secondary | ICD-10-CM

## 2018-06-30 ENCOUNTER — Other Ambulatory Visit: Payer: Self-pay | Admitting: *Deleted

## 2018-06-30 MED ORDER — LISINOPRIL-HYDROCHLOROTHIAZIDE 20-12.5 MG PO TABS: 2.0000 | ORAL_TABLET | Freq: Every day | ORAL | 0 refills | Status: DC

## 2018-06-30 NOTE — Telephone Encounter (Signed)
Contacted pt.  Advised to make appt and I would send request to PCP for meds to last till appt. Pt is agreeable and appt made for 07/25/18 @ 945. Rolfe Hartsell, Maryjo RochesterJessica Dawn, CMA

## 2018-07-03 ENCOUNTER — Ambulatory Visit
Admission: RE | Admit: 2018-07-03 | Discharge: 2018-07-03 | Disposition: A | Discharge status: Home / Self Care | Payer: BC Managed Care – PPO | Source: Ambulatory Visit | Attending: Internal Medicine | Admitting: Internal Medicine

## 2018-07-03 DIAGNOSIS — Z1231 Encounter for screening mammogram for malignant neoplasm of breast: Secondary | ICD-10-CM

## 2018-07-25 ENCOUNTER — Encounter: Payer: Self-pay | Admitting: Family Medicine

## 2018-07-25 ENCOUNTER — Ambulatory Visit: Payer: BC Managed Care – PPO | Admitting: Family Medicine

## 2018-07-25 ENCOUNTER — Other Ambulatory Visit: Payer: Self-pay

## 2018-07-25 VITALS — BP 142/90 | HR 80 | Temp 98.1°F | Ht 68.0 in | Wt 170.0 lb

## 2018-07-25 DIAGNOSIS — I10 Essential (primary) hypertension: Secondary | ICD-10-CM | POA: Diagnosis not present

## 2018-07-25 DIAGNOSIS — R7309 Other abnormal glucose: Secondary | ICD-10-CM | POA: Diagnosis not present

## 2018-07-25 LAB — POCT GLYCOSYLATED HEMOGLOBIN (HGB A1C): HBA1C, POC (CONTROLLED DIABETIC RANGE): 5.5 % (ref 0.0–7.0)

## 2018-07-25 NOTE — Progress Notes (Signed)
    Subjective:  Lynn Conley is a 57 y.o. female who presents to the [facility] today with a chief complaint of blood pressure check.   HPI: Patient with chronic HTN on medication comes in for checkup.  She has been active (works as a Arboriculturist) and been trying to minimize salt in diet.  She also has been taking her meds regularly although she did not take the meds this morning because she missed breakfast.  No chest pain/sob/other symptoms to report.  No A1C on record but did have high CBGs on last two draws.  Patient consents to A1C.  Objective:  Physical Exam: BP (!) 142/90   Pulse 80   Temp 98.1 F (36.7 C) (Oral)   Ht 5\' 8"  (1.727 m)   Wt 170 lb (77.1 kg)   LMP 08/23/2011   SpO2 99%   BMI 25.85 kg/m   Gen: NAD, resting comfortably CV: RRR with no murmurs appreciated Pulm: NWOB, CTAB with no crackles, wheezes, or rhonchi GI: Normal bowel sounds present. Soft, Nontender, Nondistended. MSK: no edema, cyanosis, or clubbing noted Skin: warm, dry Neuro: grossly normal, moves all extremities Psych: Normal affect and thought content  Results for orders placed or performed in visit on 07/25/18 (from the past 72 hour(s))  HgB A1c     Status: None   Collection Time: 07/25/18  9:53 AM  Result Value Ref Range   Hemoglobin A1C     HbA1c POC (<> result, manual entry)     HbA1c, POC (prediabetic range)     HbA1c, POC (controlled diabetic range) 5.5 0.0 - 7.0 %     Assessment/Plan:  HYPERTENSION, BENIGN SYSTEMIC 142/90 but did not take meds this morning.  Advised to take meds as scheduled before next visit.  Shared decision making to not increase/add meds at this visit with her so close to goal and having missed today's doses.  Elevated glucose A1C 5.5 (taken due to hx of elevated glucose.  Patient congratulated on good A1C and explain the status of a 5.8 as "prediabetic"   Marthenia Rolling, DO FAMILY MEDICINE RESIDENT - PGY2 07/25/2018 10:32 AM

## 2018-07-25 NOTE — Assessment & Plan Note (Signed)
142/90 but did not take meds this morning.  Advised to take meds as scheduled before next visit.  Shared decision making to not increase/add meds at this visit with her so close to goal and having missed today's doses.

## 2018-07-25 NOTE — Assessment & Plan Note (Signed)
A1C 5.5 (taken due to hx of elevated glucose.  Patient congratulated on good A1C and explain the status of a 5.8 as "prediabetic"

## 2018-07-25 NOTE — Patient Instructions (Signed)
It was a pleasure to see you today! Thank you for choosing [facility] for your primary care. Lynn Conley was seen for blood pressure management. Come back to the clinic if you have any routine concerns, and go to the emergency room if you have any life threatening problems.  Please take your blood pressure meds before your next visit.  I think with your pressure being borderline and not taking your meds today that we don't need to add more medications yet.  Congratulations on your A1C (diabetes test) being so low, you don't even qualify as pre-diabetes.   Please bring all your medications to every doctors visit   Sign up for My Chart to have easy access to your labs results, and communication with your Primary care physician.     Please check-out at the front desk before leaving the clinic.     Best,  Dr. Marthenia Rolling FAMILY MEDICINE RESIDENT - PGY2 07/25/2018 10:09 AM

## 2018-10-25 ENCOUNTER — Other Ambulatory Visit: Payer: Self-pay | Admitting: Family Medicine

## 2018-12-20 ENCOUNTER — Other Ambulatory Visit: Payer: Self-pay | Admitting: Family Medicine

## 2019-03-08 ENCOUNTER — Other Ambulatory Visit: Payer: Self-pay | Admitting: Family Medicine

## 2019-08-07 ENCOUNTER — Other Ambulatory Visit: Payer: Self-pay | Admitting: Internal Medicine

## 2019-08-07 DIAGNOSIS — Z1231 Encounter for screening mammogram for malignant neoplasm of breast: Secondary | ICD-10-CM

## 2019-09-20 ENCOUNTER — Other Ambulatory Visit: Payer: Self-pay

## 2019-09-20 ENCOUNTER — Ambulatory Visit
Admission: RE | Admit: 2019-09-20 | Discharge: 2019-09-20 | Disposition: A | Discharge status: Home / Self Care | Payer: BC Managed Care – PPO | Source: Ambulatory Visit | Attending: Internal Medicine | Admitting: Internal Medicine

## 2019-09-20 DIAGNOSIS — Z1231 Encounter for screening mammogram for malignant neoplasm of breast: Secondary | ICD-10-CM

## 2019-09-21 ENCOUNTER — Other Ambulatory Visit: Payer: Self-pay | Admitting: Internal Medicine

## 2019-09-21 DIAGNOSIS — R928 Other abnormal and inconclusive findings on diagnostic imaging of breast: Secondary | ICD-10-CM

## 2019-09-25 ENCOUNTER — Other Ambulatory Visit: Payer: Self-pay | Admitting: Family Medicine

## 2019-09-25 DIAGNOSIS — R928 Other abnormal and inconclusive findings on diagnostic imaging of breast: Secondary | ICD-10-CM

## 2019-09-27 ENCOUNTER — Other Ambulatory Visit: Payer: Self-pay | Admitting: Family Medicine

## 2019-09-27 ENCOUNTER — Ambulatory Visit
Admission: RE | Admit: 2019-09-27 | Discharge: 2019-09-27 | Disposition: A | Discharge status: Home / Self Care | Payer: BC Managed Care – PPO | Source: Ambulatory Visit | Attending: Internal Medicine | Admitting: Internal Medicine

## 2019-09-27 ENCOUNTER — Encounter: Payer: Self-pay | Admitting: Family Medicine

## 2019-09-27 ENCOUNTER — Other Ambulatory Visit: Payer: Self-pay

## 2019-09-27 DIAGNOSIS — R928 Other abnormal and inconclusive findings on diagnostic imaging of breast: Secondary | ICD-10-CM

## 2019-09-27 DIAGNOSIS — N6320 Unspecified lump in the left breast, unspecified quadrant: Secondary | ICD-10-CM

## 2019-10-08 ENCOUNTER — Other Ambulatory Visit: Payer: Self-pay

## 2019-10-08 MED ORDER — LISINOPRIL-HYDROCHLOROTHIAZIDE 20-12.5 MG PO TABS: 1.0000 | ORAL_TABLET | Freq: Every day | ORAL | 0 refills | Status: DC

## 2020-01-06 ENCOUNTER — Other Ambulatory Visit: Payer: Self-pay | Admitting: Family Medicine

## 2020-01-19 ENCOUNTER — Ambulatory Visit: Payer: BC Managed Care – PPO | Attending: Internal Medicine

## 2020-01-19 DIAGNOSIS — Z23 Encounter for immunization: Secondary | ICD-10-CM | POA: Insufficient documentation

## 2020-01-19 NOTE — Progress Notes (Signed)
   Covid-19 Vaccination Clinic  Name:  Lynn Conley    MRN: [id number] DOB: 06-07-1961  01/19/2020  [name] was observed post Covid-19 immunization for 15 minutes without incidence. She was provided with Vaccine Information Sheet and instruction to access the V-Safe system.   [name] was instructed to call 911 with any severe reactions post vaccine: Marland Kitchen Difficulty breathing  . Swelling of your face and throat  . A fast heartbeat  . A bad rash all over your body  . Dizziness and weakness    Immunizations Administered    Name Date Dose VIS Date Route   Pfizer COVID-19 Vaccine 01/19/2020  6:02 PM 0.3 mL 11/02/2019 Intramuscular   Manufacturer: [employer], [employer]   Lot: [id number]   NDC: [address]-6

## 2020-02-09 ENCOUNTER — Ambulatory Visit: Payer: BC Managed Care – PPO

## 2020-02-16 ENCOUNTER — Ambulatory Visit: Payer: BC Managed Care – PPO | Attending: Internal Medicine

## 2020-02-16 DIAGNOSIS — Z23 Encounter for immunization: Secondary | ICD-10-CM

## 2020-02-16 NOTE — Progress Notes (Signed)
   Covid-19 Vaccination Clinic  Name:  Lynn Conley    MRN: [id number] DOB: 1961/06/30  02/16/2020  [name] was observed post Covid-19 immunization for 15 minutes without incident. She was provided with Vaccine Information Sheet and instruction to access the V-Safe system.   [name] was instructed to call 911 with any severe reactions post vaccine: Marland Kitchen Difficulty breathing  . Swelling of face and throat  . A fast heartbeat  . A bad rash all over body  . Dizziness and weakness   Immunizations Administered    Name Date Dose VIS Date Route   Pfizer COVID-19 Vaccine 02/16/2020  9:23 AM 0.3 mL 11/02/2019 Intramuscular   Manufacturer: [employer], [employer]   Lot: [id number]   NDC: [address]-2

## 2020-03-27 ENCOUNTER — Other Ambulatory Visit: Payer: Self-pay | Admitting: Family Medicine

## 2020-03-27 ENCOUNTER — Ambulatory Visit
Admission: RE | Admit: 2020-03-27 | Discharge: 2020-03-27 | Disposition: A | Discharge status: Home / Self Care | Payer: BC Managed Care – PPO | Source: Ambulatory Visit | Attending: Family Medicine | Admitting: Family Medicine

## 2020-03-27 ENCOUNTER — Other Ambulatory Visit: Payer: Self-pay

## 2020-03-27 DIAGNOSIS — N6320 Unspecified lump in the left breast, unspecified quadrant: Secondary | ICD-10-CM

## 2020-04-05 ENCOUNTER — Other Ambulatory Visit: Payer: Self-pay | Admitting: Family Medicine

## 2020-04-05 DIAGNOSIS — I10 Essential (primary) hypertension: Secondary | ICD-10-CM

## 2020-04-07 NOTE — Telephone Encounter (Signed)
Contacted pt and informed her of below and scheduled an appointment for 04/23/2020. Michon Kaczmarek Zimmerman Rumple, CMA

## 2020-04-07 NOTE — Telephone Encounter (Signed)
Will refill 30 days of medication, patient has not had blood test for over a year and will need to come in for blood pressure evaluation and lab work before any further refills for this medication can be done.  Dr. Parke Simmers

## 2020-04-23 ENCOUNTER — Other Ambulatory Visit (HOSPITAL_COMMUNITY)
Admission: RE | Admit: 2020-04-23 | Discharge: 2020-04-23 | Disposition: A | Discharge status: Home / Self Care | Payer: BC Managed Care – PPO | Source: Ambulatory Visit | Attending: Family Medicine | Admitting: Family Medicine

## 2020-04-23 ENCOUNTER — Encounter: Payer: Self-pay | Admitting: Family Medicine

## 2020-04-23 ENCOUNTER — Other Ambulatory Visit: Payer: Self-pay

## 2020-04-23 ENCOUNTER — Ambulatory Visit: Payer: BC Managed Care – PPO | Admitting: Family Medicine

## 2020-04-23 VITALS — BP 132/92 | HR 87 | Ht 68.0 in | Wt 173.0 lb

## 2020-04-23 DIAGNOSIS — I10 Essential (primary) hypertension: Secondary | ICD-10-CM

## 2020-04-23 DIAGNOSIS — Z124 Encounter for screening for malignant neoplasm of cervix: Secondary | ICD-10-CM

## 2020-04-23 NOTE — Progress Notes (Signed)
    SUBJECTIVE:   CHIEF COMPLAINT / HPI: Hypertension  Patient is feeling well and has no complaints, she is only here because I told her she had to come in for her annual lab work because she is on lisinopril HCTZ.  She does say she has no symptoms but she is overdue for Pap and is willing to get that done as well  PERTINENT  PMH / PSH:    OBJECTIVE:   BP (!) 132/92   Pulse 87   Ht 5\' 8"  (1.727 m)   Wt 173 lb (78.5 kg)   LMP 08/23/2011   SpO2 99%   BMI 26.30 kg/m   General: Alert, pleasant, appropriate weight, no distress GU exam*performed entirely with CMA Sheri in the room*no externally visualized lesions, no pathologic discharge or internal lesions visualized.  Appearance of cervix is normal.  No bleeding.  ASSESSMENT/PLAN:   HYPERTENSION, BENIGN SYSTEMIC Well-controlled on current regimen, no symptoms, only came in because is been over a year since her last BMP  If BMP is nonconcerning we will continue current regimen  Screening for malignant neoplasm of cervix No symptoms or concerning history, patient consents to Pap with normal physical exam.       10/23/2011, DO [facility]

## 2020-04-23 NOTE — Assessment & Plan Note (Signed)
Well-controlled on current regimen, no symptoms, only came in because is been over a year since her last BMP  If BMP is nonconcerning we will continue current regimen

## 2020-04-23 NOTE — Assessment & Plan Note (Signed)
No symptoms or concerning history, patient consents to Pap with normal physical exam.

## 2020-04-23 NOTE — Addendum Note (Signed)
Addended by: Telford Nab on: 04/23/2020 09:46 AM   Modules accepted: Orders

## 2020-04-23 NOTE — Patient Instructions (Signed)
Today we performed a Pap smear and ordered some basic blood work to verify that we can keep using the blood pressure medicine or using.  We will also getting a cholesterol test.  Glad you are feeling well, I will call you and leave messages for you on the phone if anything needs to be changed.  This phone call might come from a colleague of mine because I will be out of town for the next week.  Dr. Parke Simmers

## 2020-04-24 LAB — BASIC METABOLIC PANEL
BUN/Creatinine Ratio: 17 (ref 12–28)
BUN: 16 mg/dL (ref 8–27)
CO2: 24 mmol/L (ref 20–29)
Calcium: 10.1 mg/dL (ref 8.7–10.3)
Chloride: 102 mmol/L (ref 96–106)
Creatinine, Ser: 0.92 mg/dL (ref 0.57–1.00)
GFR calc Af Amer: 78 mL/min/{1.73_m2} (ref >59)
GFR calc non Af Amer: 68 mL/min/{1.73_m2} (ref >59)
Glucose: 96 mg/dL (ref 65–99)
Potassium: 4 mmol/L (ref 3.5–5.2)
Sodium: 139 mmol/L (ref 134–144)

## 2020-04-24 LAB — LIPID PANEL
Chol/HDL Ratio: 2.8 ratio (ref 0.0–4.4)
Cholesterol, Total: 262 mg/dL — ABNORMAL HIGH (ref 100–199)
HDL: 94 mg/dL (ref >39)
LDL Chol Calc (NIH): 160 mg/dL — ABNORMAL HIGH (ref 0–99)
Triglycerides: 54 mg/dL (ref 0–149)
VLDL Cholesterol Cal: 8 mg/dL (ref 5–40)

## 2020-04-25 LAB — CYTOLOGY - PAP
Adequacy: ABSENT
Comment: NEGATIVE
Diagnosis: NEGATIVE
High risk HPV: NEGATIVE

## 2020-05-04 ENCOUNTER — Other Ambulatory Visit: Payer: Self-pay | Admitting: Family Medicine

## 2020-05-04 DIAGNOSIS — I10 Essential (primary) hypertension: Secondary | ICD-10-CM

## 2020-05-05 ENCOUNTER — Telehealth: Payer: Self-pay | Admitting: Family Medicine

## 2020-05-05 DIAGNOSIS — E785 Hyperlipidemia, unspecified: Secondary | ICD-10-CM

## 2020-05-05 DIAGNOSIS — E1169 Type 2 diabetes mellitus with other specified complication: Secondary | ICD-10-CM

## 2020-05-05 MED ORDER — ATORVASTATIN CALCIUM 40 MG PO TABS: 40.0000 mg | ORAL_TABLET | Freq: Every day | ORAL | 3 refills | Status: DC

## 2020-05-05 NOTE — Telephone Encounter (Signed)
LDL 160, ASCVD risk for 10 years at 7%.  Called and discussed with patient she would like to start medication therapy.  Atorvastatin 40 sent to pharmacy  Dr. Parke Simmers

## 2020-09-29 ENCOUNTER — Other Ambulatory Visit: Payer: Self-pay

## 2020-09-29 ENCOUNTER — Ambulatory Visit
Admission: RE | Admit: 2020-09-29 | Discharge: 2020-09-29 | Disposition: A | Discharge status: Home / Self Care | Payer: BC Managed Care – PPO | Source: Ambulatory Visit | Attending: Family Medicine | Admitting: Family Medicine

## 2020-09-29 ENCOUNTER — Other Ambulatory Visit: Payer: BC Managed Care – PPO

## 2020-09-29 DIAGNOSIS — N6320 Unspecified lump in the left breast, unspecified quadrant: Secondary | ICD-10-CM

## 2020-10-06 ENCOUNTER — Other Ambulatory Visit: Payer: Self-pay

## 2020-10-06 DIAGNOSIS — I10 Essential (primary) hypertension: Secondary | ICD-10-CM

## 2020-10-06 MED ORDER — LISINOPRIL-HYDROCHLOROTHIAZIDE 20-12.5 MG PO TABS: 1.0000 | ORAL_TABLET | Freq: Every day | ORAL | 3 refills | Status: DC

## 2021-01-19 ENCOUNTER — Other Ambulatory Visit: Payer: Self-pay | Admitting: Family Medicine

## 2021-01-19 DIAGNOSIS — I10 Essential (primary) hypertension: Secondary | ICD-10-CM

## 2021-05-14 ENCOUNTER — Other Ambulatory Visit: Payer: Self-pay | Admitting: Family Medicine

## 2021-05-14 DIAGNOSIS — I10 Essential (primary) hypertension: Secondary | ICD-10-CM

## 2021-08-21 ENCOUNTER — Other Ambulatory Visit: Payer: Self-pay | Admitting: Family Medicine

## 2021-08-21 DIAGNOSIS — N6320 Unspecified lump in the left breast, unspecified quadrant: Secondary | ICD-10-CM

## 2021-09-18 ENCOUNTER — Other Ambulatory Visit: Payer: Self-pay

## 2021-09-18 DIAGNOSIS — I10 Essential (primary) hypertension: Secondary | ICD-10-CM

## 2021-09-18 MED ORDER — LISINOPRIL-HYDROCHLOROTHIAZIDE 20-12.5 MG PO TABS: 1.0000 | ORAL_TABLET | Freq: Every day | ORAL | 3 refills | Status: DC

## 2021-09-30 ENCOUNTER — Ambulatory Visit
Admission: RE | Admit: 2021-09-30 | Discharge: 2021-09-30 | Disposition: A | Discharge status: Home / Self Care | Payer: BC Managed Care – PPO | Source: Ambulatory Visit | Attending: Family Medicine | Admitting: Family Medicine

## 2021-09-30 ENCOUNTER — Other Ambulatory Visit: Payer: Self-pay

## 2021-09-30 DIAGNOSIS — N6320 Unspecified lump in the left breast, unspecified quadrant: Secondary | ICD-10-CM

## 2022-07-09 ENCOUNTER — Ambulatory Visit: Payer: BC Managed Care – PPO | Admitting: Student

## 2022-07-09 ENCOUNTER — Encounter: Payer: Self-pay | Admitting: Student

## 2022-07-09 VITALS — BP 134/90 | HR 70 | Ht 68.0 in | Wt 166.0 lb

## 2022-07-09 DIAGNOSIS — R7309 Other abnormal glucose: Secondary | ICD-10-CM | POA: Diagnosis not present

## 2022-07-09 DIAGNOSIS — I10 Essential (primary) hypertension: Secondary | ICD-10-CM | POA: Diagnosis not present

## 2022-07-09 DIAGNOSIS — D509 Iron deficiency anemia, unspecified: Secondary | ICD-10-CM

## 2022-07-09 DIAGNOSIS — R928 Other abnormal and inconclusive findings on diagnostic imaging of breast: Secondary | ICD-10-CM

## 2022-07-09 DIAGNOSIS — E785 Hyperlipidemia, unspecified: Secondary | ICD-10-CM

## 2022-07-09 DIAGNOSIS — E1169 Type 2 diabetes mellitus with other specified complication: Secondary | ICD-10-CM

## 2022-07-09 DIAGNOSIS — Z1321 Encounter for screening for nutritional disorder: Secondary | ICD-10-CM

## 2022-07-09 LAB — POCT GLYCOSYLATED HEMOGLOBIN (HGB A1C): Hemoglobin A1C: 5.5 % (ref 4.0–5.6)

## 2022-07-09 MED ORDER — LISINOPRIL-HYDROCHLOROTHIAZIDE 20-12.5 MG PO TABS: 1.0000 | ORAL_TABLET | Freq: Every day | ORAL | 3 refills | Status: DC

## 2022-07-09 MED ORDER — ATORVASTATIN CALCIUM 40 MG PO TABS: 40.0000 mg | ORAL_TABLET | Freq: Every day | ORAL | 3 refills | Status: DC

## 2022-07-09 NOTE — Assessment & Plan Note (Signed)
Due for a mammogram 09/2022

## 2022-07-09 NOTE — Patient Instructions (Addendum)
It was great to see you today! Thank you for choosing [facility] for your primary care.   Today we addressed: Your blood pressure, I have sent in your medication to the pharmacy. Lets have you back to recheck your blood pressure in 3 months  Your ears. Your right ear was completely clean with no wax. You had some compacted wax in your left ear that was close to your ear drum. Avoid using anything that you put into your ear canal. I would try over the counter ear solution to help with wax. If you start having trouble hearing through that ear let me know and I can send you to see a specialist.  Your elevated cholesterol. I refilled you Lipitor 40 mg to your pharmacy.  You are due for a shingrix vaccine. You can get this at your local pharmacy.  I have ordered several labs today: cholesterol, blood counts, Vitamin D. Please come to your scheduled lab appointment to get these done. I will send you a message on mychart when they result.   If you haven't already, sign up for My Chart to have easy access to your labs results, and communication with your primary care physician.  We are checking some labs today. If they are abnormal, I will call you. If they are normal, I will send you a MyChart message (if it is active) or a letter in the mail. If you do not hear about your labs in the next 2 weeks, please call the office.   You should return to our clinic Return in about 6 months (around 01/09/2023).  I recommend that you always bring your medications to each appointment as this makes it easy to ensure you are on the correct medications and helps [place] not miss refills when you need them.  Please arrive 15 minutes before your appointment to ensure smooth check in process.  We appreciate your efforts in making this happen.  Please call the clinic at [phone] if your symptoms worsen or you have any concerns.  Thank you for allowing me to participate in your care, Dr. Hyacinth Meeker

## 2022-07-09 NOTE — Assessment & Plan Note (Signed)
Historical LDL was elevated at 160 in 2022. She was prescribed Lipitor 40 mg but did not take it.  - prescribe 40 mg Lipitor  - check LDL in 3-6 months to evaluate response.

## 2022-07-09 NOTE — Assessment & Plan Note (Signed)
Previous Hx of elevated glucose - A1c in the office 5.5

## 2022-07-09 NOTE — Progress Notes (Signed)
    SUBJECTIVE:   CHIEF COMPLAINT / HPI:   [name] is a 61 y.o. female presenting today for medication management and refills. She uses Q-tips regularly at home. She would like for me to look in her ears to make sure there is no wax build-up. She denies any changes in hearing.    PERTINENT  PMH / PSH:  HTN: Lisinopril-HCTZ 20-12.5 mg  HLD: 40 mg Lipitor, due for lipid panel 04/2020, LDL 160 Due for A1c check 38/4665, 5.5 Last Pap: 2021, neg HPV, NILM Mammogram: 09/2021 w/ f/u U/S: due 09/2022  OBJECTIVE:   BP (!) 134/90   Pulse 70   Ht 5\' 8"  (1.727 m)   Wt 166 lb (75.3 kg)   LMP 08/23/2011   BMI 25.24 kg/m   Patient is well appearing in NAD Cardio: regular rate and rhythm, no murmurs  Pulm: clear, no wheezing  Abdomen: soft, non-tender  ENT: right ear canal clear, normal TM; left ear canal clear, wax buildup at TM.  Extremities: no peripheral edema   ASSESSMENT/PLAN:   Hyperlipidemia Historical LDL was elevated at 160 in 2022. She was prescribed Lipitor 40 mg but did not take it.  - prescribe 40 mg Lipitor  - check LDL in 3-6 months to evaluate response.    ANEMIA, IRON DEFICIENCY, UNSPEC. Denies weakness, SOB.  Check CBC to assess Hgb.   HYPERTENSION, BENIGN SYSTEMIC BP in the office 134/90. Patient reports this has improved from her previous readings. She was prescribed 2.5 mg amlodipine but never took it.  - prescribe 20-12.5 mg lisinopril-HCTZ  - recheck in 3 months   Elevated glucose Previous Hx of elevated glucose - A1c in the office 5.5  Abnormal mammogram Due for a mammogram 09/2022     10/2022, DO [facility]

## 2022-07-09 NOTE — Assessment & Plan Note (Addendum)
Denies weakness, SOB.  Check CBC to assess Hgb.

## 2022-07-09 NOTE — Assessment & Plan Note (Signed)
BP in the office 134/90. Patient reports this has improved from her previous readings. She was prescribed 2.5 mg amlodipine but never took it.  - prescribe 20-12.5 mg lisinopril-HCTZ  - recheck in 3 months

## 2022-07-13 ENCOUNTER — Other Ambulatory Visit: Payer: BC Managed Care – PPO

## 2022-07-13 DIAGNOSIS — I10 Essential (primary) hypertension: Secondary | ICD-10-CM

## 2022-07-13 DIAGNOSIS — Z1321 Encounter for screening for nutritional disorder: Secondary | ICD-10-CM

## 2022-07-13 DIAGNOSIS — D509 Iron deficiency anemia, unspecified: Secondary | ICD-10-CM

## 2022-07-13 DIAGNOSIS — E785 Hyperlipidemia, unspecified: Secondary | ICD-10-CM

## 2022-07-14 LAB — CBC
Hematocrit: 38.3 % (ref 34.0–46.6)
Hemoglobin: 12.9 g/dL (ref 11.1–15.9)
MCH: 31.5 pg (ref 26.6–33.0)
MCHC: 33.7 g/dL (ref 31.5–35.7)
MCV: 94 fL (ref 79–97)
Platelets: 215 10*3/uL (ref 150–450)
RBC: 4.09 x10E6/uL (ref 3.77–5.28)
RDW: 12.3 % (ref 11.7–15.4)
WBC: 3.2 10*3/uL — ABNORMAL LOW (ref 3.4–10.8)

## 2022-07-14 LAB — BASIC METABOLIC PANEL
BUN/Creatinine Ratio: 18 (ref 12–28)
BUN: 14 mg/dL (ref 8–27)
CO2: 23 mmol/L (ref 20–29)
Calcium: 9.4 mg/dL (ref 8.7–10.3)
Chloride: 102 mmol/L (ref 96–106)
Creatinine, Ser: 0.78 mg/dL (ref 0.57–1.00)
Glucose: 95 mg/dL (ref 70–99)
Potassium: 3.8 mmol/L (ref 3.5–5.2)
Sodium: 143 mmol/L (ref 134–144)
eGFR: 86 mL/min/{1.73_m2} (ref >59)

## 2022-07-14 LAB — LIPID PANEL
Chol/HDL Ratio: 2.1 ratio (ref 0.0–4.4)
Cholesterol, Total: 208 mg/dL — ABNORMAL HIGH (ref 100–199)
HDL: 99 mg/dL (ref >39)
LDL Chol Calc (NIH): 101 mg/dL — ABNORMAL HIGH (ref 0–99)
Triglycerides: 44 mg/dL (ref 0–149)
VLDL Cholesterol Cal: 8 mg/dL (ref 5–40)

## 2022-07-14 LAB — VITAMIN D 25 HYDROXY (VIT D DEFICIENCY, FRACTURES): Vit D, 25-Hydroxy: 13.9 ng/mL — ABNORMAL LOW (ref 30.0–100.0)

## 2022-07-26 ENCOUNTER — Other Ambulatory Visit: Payer: Self-pay | Admitting: Student

## 2022-07-26 DIAGNOSIS — E559 Vitamin D deficiency, unspecified: Secondary | ICD-10-CM

## 2022-07-26 MED ORDER — VITAMIN D (ERGOCALCIFEROL) 1.25 MG (50000 UNIT) PO CAPS: 50000.0000 [IU] | ORAL_CAPSULE | ORAL | 0 refills | Status: DC

## 2022-07-26 NOTE — Progress Notes (Signed)
Called Lynn Conley to discuss her lab results.  Her vitamin D is 13.9.   She would like to take a once weekly pill.  I will send in a prescription for 50,000 IUs weekly for 5 weeks and recheck her vitamin D next appointment in 6 months.  Glendale Chard, DO 07/26/22 4:53 PM

## 2022-08-09 ENCOUNTER — Telehealth: Payer: Self-pay

## 2022-08-09 NOTE — Telephone Encounter (Signed)
Patient calls nurse line reporting possible stomach flu.   Patient reports she ate at a new restaurant on Thursday evening and started having symptoms on Friday, vomiting and diarrhea.  Patient reports symptoms went on into Saturday and eventually stopped.   Patient reports feeling weak and tired. Patient denies any fevers, chills or abdominal pain.   Patient reports she missed work on Friday and today.   Patient scheduled for tomorrow for evaluation per request.   Patient advised to drink plenty of fluids, rest and recommended BRAT diet.

## 2022-08-10 ENCOUNTER — Ambulatory Visit: Payer: BC Managed Care – PPO | Admitting: Student

## 2022-08-10 ENCOUNTER — Other Ambulatory Visit: Payer: Self-pay

## 2022-08-10 VITALS — BP 130/87 | HR 83 | Wt 163.8 lb

## 2022-08-10 DIAGNOSIS — A059 Bacterial foodborne intoxication, unspecified: Secondary | ICD-10-CM

## 2022-08-10 NOTE — Progress Notes (Signed)
  SUBJECTIVE:   CHIEF COMPLAINT / HPI:   GI Illness, vomiting and diarrhea   Ate at a restaurant on Thursday, thinks she got food poisoning. She didn't go to work on Friday or monday. Is feeling better, just notes that she's feeling weak. Appetite is decreased, but was able to go to work today. Initially had vomiting and diarrhea, but continues to have diarrhea off and on. Usually has two BM a day, has had 2 this am, and may have another this evening. BM were loose, denies foul smelling. Denies any fevers, body aches, but does appreciate some cold chills. Reports she is the only one in household who had symptoms.   Vomiting: threw up 2 times back to back on Friday, at start of symptoms, and none since.  Diarrhea: was having 3 loose stools at start of symptoms  Reports mostly feeling recovered, but also tired and weak with low appetite, is staying hydrated and eating some. Reports not feeling dizzy or light headed.   PERTINENT  PMH / PSH: HTN   OBJECTIVE:  BP 130/87   Pulse 83   Wt 163 lb 12.8 oz (74.3 kg)   LMP 08/23/2011   SpO2 98%   BMI 24.91 kg/m   General: NAD, pleasant, able to participate in exam HEENT: MMM, throat pink/clear, no tonsillar exudate Cardiac: RRR, no murmurs auscultated. Respiratory: CTAB, normal effort, no wheezes, rales or rhonchi Abdomen: soft, non-tender, non-distended, normoactive bowel sounds Extremities: warm and well perfused, no edema or cyanosis, cap refill < 2 sec Skin: warm and dry, no rashes noted Neuro: alert, no obvious focal deficits, speech normal Psych: Normal affect and mood  ASSESSMENT/PLAN:  Food Poisoning Patient complaining of diarrhea and vomiting following eating at [employer]. Patient only one in household with symptoms. Patient reports symptoms are improving, but continues to have intermittent diarrhea and fatigue. Patient denies any foul smell to diarrhea, or any abdominal pain. Low concern for GI illness given hx, but possible.  Patient likely suffering from food poisoning. Patient eating some and drinking well, does not appear dehydrated on exam with good cap refill. Will have patient do symptom management with return precautions -Supportive care -Return precautions  No problem-specific Assessment & Plan notes found for this encounter.  No orders of the defined types were placed in this encounter.  No orders of the defined types were placed in this encounter.  No follow-ups on file. @SIGNNOTE @

## 2022-08-10 NOTE — Patient Instructions (Signed)
It was great to see you! Thank you for allowing me to participate in your care!  Food Poisoning  Our plans for today:   - It sounds like you had gotten food poisoning, or a GI bug. This will resolve on it own, and there is no need for lab work today.  - Continue to eat and drink as tolerated - Make follow up appointment/seek medical care If:  -diarrhea increases, blood in stool, dizziness/lightheaded ness, or develop fevers/abdominal pain - If you develop diarrhea again, consider holding your blood pressure medicine for the day, until symptoms improve.   Take care and seek immediate care sooner if you develop any concerns.   Dr. Holley Bouche, MD [place]

## 2022-08-15 ENCOUNTER — Other Ambulatory Visit: Payer: Self-pay | Admitting: Student

## 2022-08-15 DIAGNOSIS — E559 Vitamin D deficiency, unspecified: Secondary | ICD-10-CM

## 2022-08-24 ENCOUNTER — Other Ambulatory Visit: Payer: Self-pay | Admitting: Student

## 2022-08-24 DIAGNOSIS — E559 Vitamin D deficiency, unspecified: Secondary | ICD-10-CM

## 2022-09-14 ENCOUNTER — Other Ambulatory Visit: Payer: Self-pay | Admitting: Student

## 2022-09-14 DIAGNOSIS — Z1231 Encounter for screening mammogram for malignant neoplasm of breast: Secondary | ICD-10-CM

## 2022-10-18 ENCOUNTER — Ambulatory Visit (INDEPENDENT_AMBULATORY_CARE_PROVIDER_SITE_OTHER): Payer: BC Managed Care – PPO | Admitting: Family Medicine

## 2022-10-18 ENCOUNTER — Encounter: Payer: Self-pay | Admitting: Family Medicine

## 2022-10-18 VITALS — BP 175/113 | HR 107 | Temp 100.0°F | Wt 164.0 lb

## 2022-10-18 DIAGNOSIS — J069 Acute upper respiratory infection, unspecified: Secondary | ICD-10-CM | POA: Diagnosis not present

## 2022-10-18 DIAGNOSIS — J029 Acute pharyngitis, unspecified: Secondary | ICD-10-CM | POA: Diagnosis not present

## 2022-10-18 DIAGNOSIS — I10 Essential (primary) hypertension: Secondary | ICD-10-CM | POA: Diagnosis not present

## 2022-10-18 LAB — POCT RAPID STREP A (OFFICE): Rapid Strep A Screen: NEGATIVE

## 2022-10-18 NOTE — Patient Instructions (Signed)
It was great to see you! Thank you for allowing me to participate in your care!  I recommend that you always bring your medications to each appointment as this makes it easy to ensure we are on the correct medications and helps [place] not miss when refills are needed.  Our plans for today:  -Please follow your blood pressure closely at home with your monitor over the next few days as your symptoms continue to improve.  Please continue supportive management with Tylenol as needed. -If the ringing in your ears does not improve with the rest of your cold symptoms please schedule follow-up appointment. -If your blood pressures continued to be elevated over 140/90 after you feel you are no longer sick please schedule follow-up appointment. -I will let you know the results of your tests today.   Take care and seek immediate care sooner if you develop any concerns.   Dr. Celine Mans, MD [facility]

## 2022-10-18 NOTE — Assessment & Plan Note (Signed)
Blood pressure elevated to 175/13.  Patient asymptomatic without chest pain shortness of breath or blurry vision.  Does have intermittent headache that coincides with URI symptoms.  Discussed close monitoring over the next 4 days as her URI likely resolved.  Instructed to make follow-up appointment if blood pressure continues to stay elevated despite adhering to her medication. -Continue Zestoretic once daily

## 2022-10-18 NOTE — Progress Notes (Signed)
    SUBJECTIVE:   CHIEF COMPLAINT / HPI: sore throat  Started Wednesday, sore throat. Ears are stopped up and popping. Runny nose. No fevers at home, some chills. No difficulty breathing. Nyquil helped for sleeping. Has ear ringing. Tried cotton and "sweet oil", states this is an old remedy. Ringing is same. Sore throat is getting better. Dry cough. No nausea or vomiting. No diarrhea.  Blood pressure - At home 140ish systolic. Did not take medication today.   PERTINENT  PMH / PSH: Allergic Rhinitis, HTN  OBJECTIVE:   BP (!) 175/113   Pulse (!) 107   Temp 100 F (37.8 C) (Oral)   Wt 164 lb (74.4 kg)   LMP 08/23/2011   SpO2 97%   BMI 24.94 kg/m   General: NAD HEENT: Pupils equal round and reactive, moist mucous membranes, posterior oropharyngeal erythema, no signs of tonsillar adenitis or exudates, bilateral nasal turbinates inflamed, no rhinorrhea, external auditory canals normal, bilateral TMs visible and bulging, no erythema, bilateral clear cone of light Cardiovascular: RRR, no murmurs, no peripheral edema Respiratory: normal WOB on RA, CTAB, no wheezes, ronchi or rales Extremities: Moving all 4 extremities equally   ASSESSMENT/PLAN:   Acute upper respiratory infection Patient likely has viral URI.  Day 5 of symptoms.  No elevated fevers productive cough or tonsillar exudates to suggest bacterial infection.  Vital stable, lung exam reassuring.  Will order COVID test for possible necessary isolation, and rapid strep A. -Discussed supportive management -Follow-up if tinnitus does not improve with resolution of symptoms  HYPERTENSION, BENIGN SYSTEMIC Blood pressure elevated to 175/13.  Patient asymptomatic without chest pain shortness of breath or blurry vision.  Does have intermittent headache that coincides with URI symptoms.  Discussed close monitoring over the next 4 days as her URI likely resolved.  Instructed to make follow-up appointment if blood pressure continues to stay  elevated despite adhering to her medication. -Continue Zestoretic once daily     Celine Mans, MD [facility]

## 2022-10-18 NOTE — Assessment & Plan Note (Signed)
Patient likely has viral URI.  Day 5 of symptoms.  No elevated fevers productive cough or tonsillar exudates to suggest bacterial infection.  Vital stable, lung exam reassuring.  Will order COVID test for possible necessary isolation, and rapid strep A. -Discussed supportive management -Follow-up if tinnitus does not improve with resolution of symptoms

## 2022-10-20 LAB — NOVEL CORONAVIRUS, NAA: SARS-CoV-2, NAA: NOT DETECTED

## 2022-11-09 ENCOUNTER — Ambulatory Visit
Admission: RE | Admit: 2022-11-09 | Discharge: 2022-11-09 | Disposition: A | Discharge status: Home / Self Care | Payer: BC Managed Care – PPO | Source: Ambulatory Visit | Attending: Physician Assistant | Admitting: Physician Assistant

## 2022-11-09 DIAGNOSIS — Z1231 Encounter for screening mammogram for malignant neoplasm of breast: Secondary | ICD-10-CM

## 2023-08-17 ENCOUNTER — Other Ambulatory Visit: Payer: Self-pay

## 2023-08-17 NOTE — Progress Notes (Addendum)
Lynn Conley [birth year] [id number]  Patient outreached by Thomasene Ripple , PharmD Candidate on 08/17/23.  Blood Pressure Readings: Last documented ambulatory systolic blood pressure: 175 Last documented ambulatory diastolic blood pressure: 113 Does the patient have a validated home blood pressure machine?: Yes (Patient reports not taking as often as she should be) They report home readings last night <170.  Medication review was performed. Is the patient taking their medications as prescribed?: Yes   The following barriers to adherence were noted: Does the patient have cost concerns?: No Does the patient have transportation concerns?: No Does the patient need assistance obtaining refills?: Yes Does the patient occassionally forget to take some of their prescribed medications?: No Does the patient feel like one/some of their medications make them feel poorly?: No Does the patient have questions or concerns about their medications?: No Does the patient have a follow up scheduled with their primary care provider/cardiologist?: Yes   Interventions: Interventions Completed: Medications were reviewed, Patient was educated on proper technique to check home blood pressure and reminded to bring home machine and readings to next provider appointment, Patient was counseled on lifestyle modifications to improve blood pressure, including DASH diet and 150 minutes of exercise/week.  The patient has follow up scheduled:  PCP: Glendale Chard, DO   Thomasene Ripple, Student-PharmD

## 2023-08-24 ENCOUNTER — Encounter: Payer: Self-pay | Admitting: Student

## 2023-08-24 ENCOUNTER — Ambulatory Visit: Payer: BC Managed Care – PPO | Admitting: Student

## 2023-08-24 VITALS — BP 147/94 | HR 81 | Ht 68.0 in | Wt 169.4 lb

## 2023-08-24 DIAGNOSIS — Z0000 Encounter for general adult medical examination without abnormal findings: Secondary | ICD-10-CM

## 2023-08-24 DIAGNOSIS — R7309 Other abnormal glucose: Secondary | ICD-10-CM | POA: Diagnosis not present

## 2023-08-24 DIAGNOSIS — I10 Essential (primary) hypertension: Secondary | ICD-10-CM

## 2023-08-24 DIAGNOSIS — E1169 Type 2 diabetes mellitus with other specified complication: Secondary | ICD-10-CM

## 2023-08-24 DIAGNOSIS — E785 Hyperlipidemia, unspecified: Secondary | ICD-10-CM

## 2023-08-24 DIAGNOSIS — E559 Vitamin D deficiency, unspecified: Secondary | ICD-10-CM | POA: Diagnosis not present

## 2023-08-24 DIAGNOSIS — Z23 Encounter for immunization: Secondary | ICD-10-CM

## 2023-08-24 DIAGNOSIS — Z7984 Long term (current) use of oral hypoglycemic drugs: Secondary | ICD-10-CM

## 2023-08-24 MED ORDER — ATORVASTATIN CALCIUM 40 MG PO TABS: 40.0000 mg | ORAL_TABLET | Freq: Every day | ORAL | 3 refills | Status: DC

## 2023-08-24 MED ORDER — LISINOPRIL-HYDROCHLOROTHIAZIDE 20-25 MG PO TABS
1.0000 | ORAL_TABLET | Freq: Every day | ORAL | 3 refills | Status: DC
Start: 2023-08-24 — End: 2024-08-16

## 2023-08-24 NOTE — Patient Instructions (Signed)
It was great to see you today!   Today we addressed: Blood pressure: I am increasing your medication to lisinopril-HCTZ 20-25 mg, you can keep taking your current medication until you finish and then start taking the new dose.  I am checking your kidney function, vitamin D, and cholesterol today. I will message you with the results.   Come back to see me in 6 months to follow up on your blood pressure, or sooner if needed.   No future appointments.  Please arrive 15 minutes before your appointment to ensure smooth check in process.    Please call the clinic at [phone] if your symptoms worsen or you have any concerns.  Thank you for allowing me to participate in your care, Dr. Glendale Chard [facility]

## 2023-08-24 NOTE — Addendum Note (Signed)
Addended by: Glendale Chard on: 08/24/2023 09:16 AM   Modules accepted: Orders

## 2023-08-24 NOTE — Assessment & Plan Note (Signed)
Refilled lipitor 40 mg  - check lipid panel today

## 2023-08-24 NOTE — Assessment & Plan Note (Addendum)
Increase Lisinopril-hydrochlorothiazide to 20-25 mg. Patient would like to finish taking her current dose of medication before picking up new prescription.  - BMP check today  - follow up in 3-6 months for BP recheck

## 2023-08-24 NOTE — Progress Notes (Signed)
    SUBJECTIVE:   CHIEF COMPLAINT / HPI:   Lynn Conley is a 62 y.o. female  presenting for follow up for blood pressure.   HTN: Lisinopril-hydrochlorothiazide 20-12.5 mg  She denies SE to the medications. She occasionally checks her BP aqt home and reports SBP >140 and DBP >85.   PERTINENT  PMH / PSH: Reviewed and updated   OBJECTIVE:   BP (!) 147/94   Pulse 81   Ht 5\' 8"  (1.727 m)   Wt 169 lb 6.4 oz (76.8 kg)   LMP 08/23/2011   SpO2 100%   BMI 25.76 kg/m   Well-appearing, no acute distress Cardio: Regular rate, regular rhythm, no murmurs on exam. Pulm: Clear, no wheezing, no crackles. No increased work of breathing Abdominal: bowel sounds present, soft, non-tender, non-distended Extremities: no peripheral edema  Neuro: alert and oriented x3, speech normal in content, no facial asymmetry, strength intact and equal bilaterally in UE and LE, pupils equal and reactive to light.  Psych:  Cognition and judgment appear intact. Alert, communicative  and cooperative with normal attention span and concentration. No apparent delusions, illusions, hallucinations      08/24/2023    8:52 AM 10/18/2022    3:21 PM 08/10/2022    1:50 PM  PHQ9 SCORE ONLY  PHQ-9 Total Score 0 0 4      ASSESSMENT/PLAN:   HYPERTENSION, BENIGN SYSTEMIC Increase Lisinopril-hydrochlorothiazide to 20-25 mg. Patient would like to finish taking her current dose of medication before picking up new prescription.  - BMP check today  - follow up in 3-6 months for BP recheck   Hyperlipidemia Refilled lipitor 40 mg  - check lipid panel today  Elevated glucose - BMP today  - A1c today    Health Maintenance:  - flu shot given today  - shingrix vaccine previously administered by outside pharmacy   Glendale Chard, DO [facility]

## 2023-08-24 NOTE — Assessment & Plan Note (Signed)
-   BMP today  - A1c today

## 2023-08-25 LAB — BASIC METABOLIC PANEL
BUN/Creatinine Ratio: 17 (ref 12–28)
BUN: 17 mg/dL (ref 8–27)
CO2: 24 mmol/L (ref 20–29)
Calcium: 9.9 mg/dL (ref 8.7–10.3)
Chloride: 101 mmol/L (ref 96–106)
Creatinine, Ser: 1.03 mg/dL — ABNORMAL HIGH (ref 0.57–1.00)
Glucose: 109 mg/dL — ABNORMAL HIGH (ref 70–99)
Potassium: 3.6 mmol/L (ref 3.5–5.2)
Sodium: 142 mmol/L (ref 134–144)
eGFR: 61 mL/min/{1.73_m2} (ref >59)

## 2023-08-25 LAB — VITAMIN D 25 HYDROXY (VIT D DEFICIENCY, FRACTURES): Vit D, 25-Hydroxy: 20.4 ng/mL — ABNORMAL LOW (ref 30.0–100.0)

## 2023-08-25 LAB — LIPID PANEL
Chol/HDL Ratio: 2.2 {ratio} (ref 0.0–4.4)
Cholesterol, Total: 202 mg/dL — ABNORMAL HIGH (ref 100–199)
HDL: 93 mg/dL (ref >39)
LDL Chol Calc (NIH): 98 mg/dL (ref 0–99)
Triglycerides: 59 mg/dL (ref 0–149)
VLDL Cholesterol Cal: 11 mg/dL (ref 5–40)

## 2023-08-25 LAB — HEMOGLOBIN A1C
Est. average glucose Bld gHb Est-mCnc: 123 mg/dL
Hgb A1c MFr Bld: 5.9 % — ABNORMAL HIGH (ref 4.8–5.6)

## 2023-09-15 ENCOUNTER — Other Ambulatory Visit: Payer: Self-pay | Admitting: Student

## 2023-09-15 DIAGNOSIS — I10 Essential (primary) hypertension: Secondary | ICD-10-CM

## 2024-01-17 ENCOUNTER — Encounter: Payer: Self-pay | Admitting: Student

## 2024-01-17 DIAGNOSIS — Z1231 Encounter for screening mammogram for malignant neoplasm of breast: Secondary | ICD-10-CM

## 2024-01-18 ENCOUNTER — Other Ambulatory Visit: Payer: Self-pay | Admitting: Family Medicine

## 2024-01-18 DIAGNOSIS — Z1231 Encounter for screening mammogram for malignant neoplasm of breast: Secondary | ICD-10-CM

## 2024-02-08 ENCOUNTER — Ambulatory Visit: Payer: Self-pay

## 2024-02-08 ENCOUNTER — Ambulatory Visit
Admission: RE | Admit: 2024-02-08 | Discharge: 2024-02-08 | Disposition: A | Discharge status: Home / Self Care | Source: Ambulatory Visit | Attending: Family Medicine | Admitting: Family Medicine

## 2024-02-08 DIAGNOSIS — Z1231 Encounter for screening mammogram for malignant neoplasm of breast: Secondary | ICD-10-CM

## 2024-06-27 DIAGNOSIS — E785 Hyperlipidemia, unspecified: Secondary | ICD-10-CM | POA: Diagnosis not present

## 2024-06-27 DIAGNOSIS — R7303 Prediabetes: Secondary | ICD-10-CM | POA: Diagnosis not present

## 2024-06-27 DIAGNOSIS — Z008 Encounter for other general examination: Secondary | ICD-10-CM | POA: Diagnosis not present

## 2024-06-27 DIAGNOSIS — Z860100 Personal history of colon polyps, unspecified: Secondary | ICD-10-CM | POA: Diagnosis not present

## 2024-06-27 DIAGNOSIS — E663 Overweight: Secondary | ICD-10-CM | POA: Diagnosis not present

## 2024-06-27 DIAGNOSIS — I10 Essential (primary) hypertension: Secondary | ICD-10-CM | POA: Diagnosis not present

## 2024-06-27 DIAGNOSIS — Z6825 Body mass index (BMI) 25.0-25.9, adult: Secondary | ICD-10-CM | POA: Diagnosis not present

## 2024-08-16 ENCOUNTER — Other Ambulatory Visit: Payer: Self-pay | Admitting: Student

## 2024-08-16 DIAGNOSIS — I10 Essential (primary) hypertension: Secondary | ICD-10-CM

## 2024-12-19 ENCOUNTER — Other Ambulatory Visit: Payer: Self-pay | Admitting: Student

## 2024-12-19 DIAGNOSIS — E1169 Type 2 diabetes mellitus with other specified complication: Secondary | ICD-10-CM

## 2024-12-26 LAB — OPHTHALMOLOGY REPORT-SCANNED
